# Patient Record
Sex: Female | Born: 1935 | Race: White | Hispanic: No | Smoking: Never smoker
Health system: Southern US, Community
[De-identification: ages and names within clinical notes are randomized; demographics above are authoritative.]

## PROBLEM LIST (undated history)

## (undated) DIAGNOSIS — E038 Other specified hypothyroidism: Secondary | ICD-10-CM

## (undated) DIAGNOSIS — E039 Hypothyroidism, unspecified: Secondary | ICD-10-CM

## (undated) DIAGNOSIS — F32A Depression, unspecified: Secondary | ICD-10-CM

## (undated) DIAGNOSIS — I10 Essential (primary) hypertension: Secondary | ICD-10-CM

## (undated) DIAGNOSIS — E7800 Pure hypercholesterolemia, unspecified: Secondary | ICD-10-CM

## (undated) DIAGNOSIS — F329 Major depressive disorder, single episode, unspecified: Secondary | ICD-10-CM

## (undated) DIAGNOSIS — M1990 Unspecified osteoarthritis, unspecified site: Secondary | ICD-10-CM

## (undated) DIAGNOSIS — H409 Unspecified glaucoma: Secondary | ICD-10-CM

## (undated) HISTORY — DX: Depression, unspecified: F32.A

## (undated) HISTORY — DX: Unspecified glaucoma: H40.9

## (undated) HISTORY — DX: Major depressive disorder, single episode, unspecified: F32.9

## (undated) HISTORY — PX: HIP FRACTURE SURGERY: SHX118

## (undated) HISTORY — DX: Hypothyroidism, unspecified: E03.9

## (undated) HISTORY — DX: Essential (primary) hypertension: I10

## (undated) HISTORY — DX: Pure hypercholesterolemia, unspecified: E78.00

## (undated) HISTORY — DX: Unspecified osteoarthritis, unspecified site: M19.90

## (undated) HISTORY — DX: Other specified hypothyroidism: E03.8

---

## 1976-01-26 HISTORY — PX: ABDOMINAL HYSTERECTOMY: SHX81

## 2004-12-23 ENCOUNTER — Inpatient Hospital Stay: Payer: Self-pay | Admitting: Internal Medicine

## 2004-12-23 ENCOUNTER — Other Ambulatory Visit: Payer: Self-pay

## 2011-02-18 DIAGNOSIS — R945 Abnormal results of liver function studies: Secondary | ICD-10-CM | POA: Diagnosis not present

## 2011-02-18 DIAGNOSIS — I10 Essential (primary) hypertension: Secondary | ICD-10-CM | POA: Diagnosis not present

## 2011-02-18 DIAGNOSIS — E7800 Pure hypercholesterolemia, unspecified: Secondary | ICD-10-CM | POA: Diagnosis not present

## 2011-02-18 DIAGNOSIS — R7989 Other specified abnormal findings of blood chemistry: Secondary | ICD-10-CM | POA: Diagnosis not present

## 2011-02-18 DIAGNOSIS — R7309 Other abnormal glucose: Secondary | ICD-10-CM | POA: Diagnosis not present

## 2011-04-05 DIAGNOSIS — F332 Major depressive disorder, recurrent severe without psychotic features: Secondary | ICD-10-CM | POA: Diagnosis not present

## 2011-04-29 DIAGNOSIS — H02039 Senile entropion of unspecified eye, unspecified eyelid: Secondary | ICD-10-CM | POA: Diagnosis not present

## 2011-05-04 DIAGNOSIS — H02039 Senile entropion of unspecified eye, unspecified eyelid: Secondary | ICD-10-CM | POA: Diagnosis not present

## 2011-05-05 DIAGNOSIS — R5381 Other malaise: Secondary | ICD-10-CM | POA: Diagnosis not present

## 2011-05-05 DIAGNOSIS — E039 Hypothyroidism, unspecified: Secondary | ICD-10-CM | POA: Diagnosis not present

## 2011-05-11 DIAGNOSIS — H02049 Spastic entropion of unspecified eye, unspecified eyelid: Secondary | ICD-10-CM | POA: Diagnosis not present

## 2011-05-11 DIAGNOSIS — H02039 Senile entropion of unspecified eye, unspecified eyelid: Secondary | ICD-10-CM | POA: Diagnosis not present

## 2011-05-19 DIAGNOSIS — R040 Epistaxis: Secondary | ICD-10-CM | POA: Diagnosis not present

## 2011-05-24 DIAGNOSIS — H0019 Chalazion unspecified eye, unspecified eyelid: Secondary | ICD-10-CM | POA: Diagnosis not present

## 2011-06-22 DIAGNOSIS — H4010X0 Unspecified open-angle glaucoma, stage unspecified: Secondary | ICD-10-CM | POA: Diagnosis not present

## 2011-07-15 DIAGNOSIS — F332 Major depressive disorder, recurrent severe without psychotic features: Secondary | ICD-10-CM | POA: Diagnosis not present

## 2011-10-14 DIAGNOSIS — F332 Major depressive disorder, recurrent severe without psychotic features: Secondary | ICD-10-CM | POA: Diagnosis not present

## 2011-10-18 DIAGNOSIS — F331 Major depressive disorder, recurrent, moderate: Secondary | ICD-10-CM | POA: Diagnosis not present

## 2011-11-03 ENCOUNTER — Ambulatory Visit (INDEPENDENT_AMBULATORY_CARE_PROVIDER_SITE_OTHER): Payer: Medicare Other | Admitting: Internal Medicine

## 2011-11-03 ENCOUNTER — Encounter: Payer: Self-pay | Admitting: Internal Medicine

## 2011-11-03 VITALS — BP 129/70 | HR 85 | Temp 97.8°F | Ht 60.75 in | Wt 130.5 lb

## 2011-11-03 DIAGNOSIS — E039 Hypothyroidism, unspecified: Secondary | ICD-10-CM

## 2011-11-03 DIAGNOSIS — F32A Depression, unspecified: Secondary | ICD-10-CM | POA: Insufficient documentation

## 2011-11-03 DIAGNOSIS — R7309 Other abnormal glucose: Secondary | ICD-10-CM | POA: Diagnosis not present

## 2011-11-03 DIAGNOSIS — I10 Essential (primary) hypertension: Secondary | ICD-10-CM | POA: Diagnosis not present

## 2011-11-03 DIAGNOSIS — E7800 Pure hypercholesterolemia, unspecified: Secondary | ICD-10-CM | POA: Diagnosis not present

## 2011-11-03 DIAGNOSIS — E038 Other specified hypothyroidism: Secondary | ICD-10-CM | POA: Insufficient documentation

## 2011-11-03 DIAGNOSIS — R739 Hyperglycemia, unspecified: Secondary | ICD-10-CM | POA: Insufficient documentation

## 2011-11-03 DIAGNOSIS — R634 Abnormal weight loss: Secondary | ICD-10-CM

## 2011-11-03 DIAGNOSIS — F3289 Other specified depressive episodes: Secondary | ICD-10-CM

## 2011-11-03 DIAGNOSIS — F329 Major depressive disorder, single episode, unspecified: Secondary | ICD-10-CM

## 2011-11-03 LAB — CBC WITH DIFFERENTIAL/PLATELET
Basophils Relative: 0.5 % (ref 0.0–3.0)
Eosinophils Relative: 2.2 % (ref 0.0–5.0)
HCT: 42.5 % (ref 36.0–46.0)
Lymphs Abs: 1.7 10*3/uL (ref 0.7–4.0)
MCV: 90.7 fl (ref 78.0–100.0)
Monocytes Absolute: 0.4 10*3/uL (ref 0.1–1.0)
Platelets: 313 10*3/uL (ref 150.0–400.0)
WBC: 6.1 10*3/uL (ref 4.5–10.5)

## 2011-11-03 LAB — COMPREHENSIVE METABOLIC PANEL
Alkaline Phosphatase: 114 U/L (ref 39–117)
BUN: 12 mg/dL (ref 6–23)
Creatinine, Ser: 0.7 mg/dL (ref 0.4–1.2)
Glucose, Bld: 111 mg/dL — ABNORMAL HIGH (ref 70–99)
Sodium: 140 mEq/L (ref 135–145)
Total Bilirubin: 0.5 mg/dL (ref 0.3–1.2)

## 2011-11-03 LAB — TSH: TSH: 0.15 u[IU]/mL — ABNORMAL LOW (ref 0.35–5.50)

## 2011-11-03 LAB — LIPID PANEL
Cholesterol: 225 mg/dL — ABNORMAL HIGH (ref 0–200)
Triglycerides: 139 mg/dL (ref 0.0–149.0)

## 2011-11-03 LAB — LDL CHOLESTEROL, DIRECT: Direct LDL: 138.6 mg/dL

## 2011-11-03 LAB — T3, FREE: T3, Free: 2.8 pg/mL (ref 2.3–4.2)

## 2011-11-03 NOTE — Assessment & Plan Note (Signed)
Has adjusted her diet and lost weight.  Check met b and a1c.

## 2011-11-03 NOTE — Assessment & Plan Note (Signed)
Stable.  Continues to see Dr Lenis Noon.  On Effexor.

## 2011-11-03 NOTE — Assessment & Plan Note (Signed)
Blood pressure has been under good control.  Continue same meds.  Check met b.

## 2011-11-03 NOTE — Patient Instructions (Signed)
It was nice seeing you today.  I am going to check some labs today.  We will notify you of the results once they are available.

## 2011-11-03 NOTE — Assessment & Plan Note (Signed)
Has adjusted her diet.  Check lipid profile.

## 2011-11-03 NOTE — Progress Notes (Signed)
  Subjective:    Patient ID: Sarah [name], female    DOB: 09-05-1923, 78 y.o.   MRN: [id number]  HPI 78 year old female with past history of hypertension and hypercholesterolemia who comes in today for a scheduled follow up.  She has been doing relatively well.  She is still being followed by Dr Lenis Noon.  States she had some labs recently and he wanted her to follow up with me regarding these labs.  I do not have a copy of the lab results.  She has "cut out sugar" and has lost weight.  She is eating.  No nausea or vomiting.  Regarding her stress and depression, she feels things are stable.    Past Medical History  Diagnosis Date  . Arthritis   . Depression   . Glaucoma   . Hypercholesterolemia   . Thyroid disease   . Hypertension     Review of Systems Patient denies any headache, lightheadedness or dizziness.  No chest pain, tightness or palpatations. No increased shortness of breath, cough or congestion.  No acid reflux, dysphagia or odynophagia.  No nausea or vomiting.  No abdominal pain or cramping.  No bowel change, such as diarrhea, constipation, BRBPR or melana.  No urine change.   Self reported weight loss with diet adjustment.     Objective:   Physical Exam Filed Vitals:   11/03/11 0946  BP: 129/70  Pulse: 85  Temp: 97.8 F (55.44 C)   78 year old female in no acute distress.   HEENT:  Nares - clear.  OP- without lesions or erythema.  NECK:  Supple, nontender.  No audible carotid bruit.   HEART:  Appears to be regular. LUNGS:  Without crackles or wheezing audible.  Respirations even and unlabored.   RADIAL PULSE:  Equal bilaterally.  ABDOMEN:  Soft, nontender.  No audible abdominal bruit.   EXTREMITIES:  No increased edema to be present.                  Assessment & Plan:  WEIGHT LOSS.  She states she has cut out sweets because she was worried about her sugar.  Will check cbc, met c, thyroid function and a1c.    HEALTH MAINTENANCE.  Schedule for a physical next  visit.  She declines mammogram and colonoscopy.

## 2011-11-03 NOTE — Assessment & Plan Note (Signed)
With the weight loss, will recheck tsh, free T3 and free T4.

## 2011-12-28 DIAGNOSIS — H4089 Other specified glaucoma: Secondary | ICD-10-CM | POA: Diagnosis not present

## 2011-12-30 DIAGNOSIS — F332 Major depressive disorder, recurrent severe without psychotic features: Secondary | ICD-10-CM | POA: Diagnosis not present

## 2012-02-21 ENCOUNTER — Ambulatory Visit (INDEPENDENT_AMBULATORY_CARE_PROVIDER_SITE_OTHER): Payer: Medicare Other | Admitting: Internal Medicine

## 2012-02-21 ENCOUNTER — Encounter: Payer: Self-pay | Admitting: Internal Medicine

## 2012-02-21 VITALS — BP 160/78 | HR 79 | Ht 60.75 in | Wt 136.5 lb

## 2012-02-21 DIAGNOSIS — I10 Essential (primary) hypertension: Secondary | ICD-10-CM | POA: Diagnosis not present

## 2012-02-21 DIAGNOSIS — E038 Other specified hypothyroidism: Secondary | ICD-10-CM | POA: Diagnosis not present

## 2012-02-21 DIAGNOSIS — E7800 Pure hypercholesterolemia, unspecified: Secondary | ICD-10-CM | POA: Diagnosis not present

## 2012-02-21 DIAGNOSIS — F329 Major depressive disorder, single episode, unspecified: Secondary | ICD-10-CM

## 2012-02-21 DIAGNOSIS — R7309 Other abnormal glucose: Secondary | ICD-10-CM

## 2012-02-21 DIAGNOSIS — F3289 Other specified depressive episodes: Secondary | ICD-10-CM

## 2012-02-21 DIAGNOSIS — F32A Depression, unspecified: Secondary | ICD-10-CM

## 2012-02-21 DIAGNOSIS — E039 Hypothyroidism, unspecified: Secondary | ICD-10-CM

## 2012-02-21 DIAGNOSIS — R739 Hyperglycemia, unspecified: Secondary | ICD-10-CM

## 2012-02-21 LAB — LIPID PANEL
Cholesterol: 229 mg/dL — ABNORMAL HIGH (ref 0–200)
HDL: 57.2 mg/dL (ref >39.00)
Triglycerides: 166 mg/dL — ABNORMAL HIGH (ref 0.0–149.0)

## 2012-02-21 LAB — BASIC METABOLIC PANEL
GFR: 82.9 mL/min (ref >60.00)
Potassium: 5.3 mEq/L — ABNORMAL HIGH (ref 3.5–5.1)
Sodium: 143 mEq/L (ref 135–145)

## 2012-02-21 LAB — HEMOGLOBIN A1C: Hgb A1c MFr Bld: 6 % (ref 4.6–6.5)

## 2012-02-21 LAB — TSH: TSH: 0.13 u[IU]/mL — ABNORMAL LOW (ref 0.35–5.50)

## 2012-02-21 MED ORDER — FLUTICASONE PROPIONATE 50 MCG/ACT NA SUSP: 2.0000 | Freq: Every day | NASAL | Status: DC

## 2012-02-21 NOTE — Progress Notes (Signed)
  Subjective:    Patient ID: Sarah [name], female    DOB: 06-16-1923, 79 y.o.   MRN: [id number]  HPI 79 year old female with past history of hypertension and hypercholesterolemia who comes in today to follow up on these issues as well as for a complete physical exam.  She has been doing relatively well.  She is still being followed by Dr Lenis Noon.  Feels she is doing well from a psychiatry stand point. No chest pain or tightness.  Breathing stable.  Some persistent wrist and shoulder pain.  Declines any further evaluation.  Not taking anything.  Discussed taking tylenol.  No nausea or vomiting.  Eating and drinking well.  [place] recently.  Has wood floors.  Was wearing socks.  Slipped - socks.  Did not hit her head.  No residual problems.      Past Medical History  Diagnosis Date  . Arthritis   . Depression   . Glaucoma   . Hypercholesterolemia   . Subclinical hypothyroidism   . Hypertension     Current Outpatient Prescriptions on File Prior to Visit  Medication Sig Dispense Refill  . amLODipine (NORVASC) 5 MG tablet Take 5 mg by mouth daily.      . Multiple Vitamins-Minerals (CENTRUM SILVER PO) Take 1 tablet by mouth daily.      Marland Kitchen venlafaxine XR (EFFEXOR-XR) 150 MG 24 hr capsule Take three tablets by mouth daily      . fluticasone (FLONASE) 50 MCG/ACT nasal spray Place 2 sprays into the nose daily.  16 g  2    Review of Systems Patient denies any headache, lightheadedness or dizziness.  No chest pain, tightness or palpatations. No increased shortness of breath, cough or congestion.  No acid reflux, dysphagia or odynophagia.  No nausea or vomiting.  No abdominal pain or cramping.  No bowel change, such as diarrhea, constipation, BRBPR or melana.  No urine change.   Some fatigue, but overall she feels she is doing well.      Objective:   Physical Exam  Filed Vitals:   02/21/12 1054  BP: 160/78  Pulse: 79   Blood pressure recheck:  61-14/27  79 year old female in no acute  distress.   HEENT:  Nares- clear.  Oropharynx - without lesions. NECK:  Supple.  Nontender.  No audible bruit.  HEART:  Appears to be regular. LUNGS:  No crackles or wheezing audible.  Respirations even and unlabored.  CHEST:  No bruising over the chest wall.  RADIAL PULSE:  Equal bilaterally.    BREASTS:  She declined.  ABDOMEN:  Soft, nontender.  Bowel sounds present and normal.  No audible abdominal bruit.  GU:  She declined.  RECTAL:  She declined.    EXTREMITIES:  No increased edema present.  DP pulses palpable and equal bilaterally.      MSK:  Limited rom in her left shoulder.  Arthritis changes - right wrist.       Assessment & Plan:  MSK.  See above.  She declines any further w/up or treatment.  Tylenol as directed.  Will notify me if she changes her mind.   FATIGUE.  Check cbc, met c and tsh.     HEALTH MAINTENANCE.  Physical today.  She declines mammogram, colonoscopy, bone density and breast/pelvic exam.

## 2012-02-22 ENCOUNTER — Encounter: Payer: Self-pay | Admitting: Internal Medicine

## 2012-02-22 NOTE — Assessment & Plan Note (Signed)
Low cholesterol diet.  Check lipid panel.    

## 2012-02-22 NOTE — Assessment & Plan Note (Signed)
Stable.  Followed by Dr Lenis Noon.

## 2012-02-22 NOTE — Assessment & Plan Note (Signed)
Check tsh, free T4 and free T3.

## 2012-02-22 NOTE — Assessment & Plan Note (Signed)
Blood pressure as outlined.  Follow.  Same medication regimen.  Check metabolic panel.

## 2012-02-22 NOTE — Assessment & Plan Note (Signed)
Low carb diet.  Check a1c.

## 2012-02-23 ENCOUNTER — Other Ambulatory Visit: Payer: Self-pay | Admitting: *Deleted

## 2012-02-24 MED ORDER — AMLODIPINE BESYLATE 5 MG PO TABS: 5.0000 mg | ORAL_TABLET | Freq: Every day | ORAL | Status: DC

## 2012-02-24 NOTE — Telephone Encounter (Signed)
Sent in to pharmacy.  

## 2012-02-26 ENCOUNTER — Telehealth: Payer: Self-pay | Admitting: Internal Medicine

## 2012-02-26 ENCOUNTER — Other Ambulatory Visit: Payer: Self-pay | Admitting: Internal Medicine

## 2012-02-26 DIAGNOSIS — E875 Hyperkalemia: Secondary | ICD-10-CM

## 2012-02-26 NOTE — Telephone Encounter (Signed)
Pt notified of labs and need for follow up potassium check.  She is coming Tuesday 02/29/12 at 10:00 for labs.  Please put on lab schedule.  Pt aware of time.

## 2012-02-26 NOTE — Progress Notes (Signed)
Order placed for follow up potassium.  

## 2012-02-28 NOTE — Telephone Encounter (Signed)
Appointment made

## 2012-02-29 ENCOUNTER — Other Ambulatory Visit (INDEPENDENT_AMBULATORY_CARE_PROVIDER_SITE_OTHER): Payer: Medicare Other

## 2012-02-29 DIAGNOSIS — E875 Hyperkalemia: Secondary | ICD-10-CM | POA: Diagnosis not present

## 2012-02-29 LAB — POTASSIUM: Potassium: 3.7 mEq/L (ref 3.5–5.1)

## 2012-04-17 ENCOUNTER — Telehealth: Payer: Self-pay | Admitting: Internal Medicine

## 2012-04-17 NOTE — Telephone Encounter (Signed)
Just an FYI

## 2012-04-17 NOTE — Telephone Encounter (Signed)
Patient Information:  Caller Name: Sarah Sarah [name] Sarah [name]  Phone: [phone]  Patient: Sarah Sarah [name], Sarah [name]  Gender: Female  DOB: 02-Jan-1924  Age: 79 Years  PCP: Dale Traill  Office Follow Up:  Does the office need to follow up with this patient?: No  Instructions For The Office: N/A  RN Note:  Caller is eating, drinking, and voiding within normal limits per caller. Caller has been walking with a cane. Right hip pain. Caller denies bruising. Caller tripped while putting on jeans and fell on hip.  Symptoms  Reason For Call & Symptoms: fell and hurt hip.  Reviewed Health History In EMR: Yes  Reviewed Medications In EMR: Yes  Reviewed Allergies In EMR: Yes  Reviewed Surgeries / Procedures: Yes  Date of Onset of Symptoms: 04/13/2012  Guideline(s) Used:  Hip Injury  Disposition Per Guideline:   See Today or Tomorrow in Office  Reason For Disposition Reached:   High-risk adult (e.g., age > 8, osteoporosis, chronic steroid use)  Advice Given:  Apply Heat to the Area:  Beginning 48 hours after an injury, apply a warm washcloth or heating pad for 10 minutes three times a day.  This will help increase blood flow and improve healing.  Rest vs. Movement:  Movement is generally more healing in the long term than rest.  Continue normal activities as much as your pain permits.  Avoid running and active sports for 1-2 weeks or until the pain and swelling are gone.  Complete rest should only be used for the first day or two after an injury. If it really hurts too much to walk, you will need to see the doctor.  Reassurance - Direct Blow (Contusion, Bruise)  A direct blow to your hip can cause a contusion. Contusion is the medical term for bruise.  Symptoms are mild pain, swelling, and/or bruising.  Here is some care advice that should help.  Expected Course:  Pain, swelling, and bruising usually start to get better 2 to 3 days after an injury.  Swelling most often is gone after 1 week.  Bruises fade away slowly over 1-2 weeks.  It may take 2 weeks for pain and tenderness of the injured area to go away.  Call Back If:  Pain becomes severe  Pain does not improve after 3 days  Pain or swelling lasts more than 2 weeks  You become worse.  Patient Will Follow Care Advice:  YES  Appointment Scheduled:  04/18/2012 09:45:00 Appointment Scheduled Provider:  Dale Ulm

## 2012-04-18 ENCOUNTER — Encounter: Payer: Self-pay | Admitting: Internal Medicine

## 2012-04-18 ENCOUNTER — Ambulatory Visit (INDEPENDENT_AMBULATORY_CARE_PROVIDER_SITE_OTHER): Payer: Medicare Other | Admitting: Internal Medicine

## 2012-04-18 VITALS — BP 150/80 | HR 88 | Temp 98.5°F | Ht 60.75 in | Wt 140.5 lb

## 2012-04-18 DIAGNOSIS — I10 Essential (primary) hypertension: Secondary | ICD-10-CM

## 2012-04-24 ENCOUNTER — Encounter: Payer: Self-pay | Admitting: Internal Medicine

## 2012-04-24 NOTE — Progress Notes (Signed)
  Subjective:    Patient ID: Sarah [name], female    DOB: 1924-01-10, 79 y.o.   MRN: [id number]  HPI 79 year old female with past history of hypertension and hypercholesterolemia who comes in today as a work in with concerns regarding rupper leg/hip pain.  She has been doing relatively well.  States she was standing and putting on her pants.  Her left foot caught.  She fell.  No head injury.  No other injury.  Went to the grocery store immediately after the fall.  Later noticed some increased discomfort.  If she has been sitting for a while and then goes to get up, she will notice the some discomfort.  Able to walk without increased difficulty.  No radiation of pain.  No numbness or tingling.      Past Medical History  Diagnosis Date  . Arthritis   . Depression   . Glaucoma   . Hypercholesterolemia   . Subclinical hypothyroidism   . Hypertension     Current Outpatient Prescriptions on File Prior to Visit  Medication Sig Dispense Refill  . amLODipine (NORVASC) 5 MG tablet Take 1 tablet (5 mg total) by mouth daily.  30 tablet  5  . fluticasone (FLONASE) 50 MCG/ACT nasal spray Place 2 sprays into the nose daily.  16 g  2  . Multiple Vitamins-Minerals (CENTRUM SILVER PO) Take 1 tablet by mouth daily.      Marland Kitchen venlafaxine XR (EFFEXOR-XR) 150 MG 24 hr capsule Take three tablets by mouth daily       No current facility-administered medications on file prior to visit.    Review of Systems Patient denies any headache, lightheadedness or dizziness.  No chest pain, tightness or palpitations. No increased shortness of breath, cough or congestion.  No acid reflux, dysphagia or odynophagia.  Does report upper leg/hip pain.  Better.  Taking some tylenol.  Able to walk without significant difficulty.      Objective:   Physical Exam  Filed Vitals:   04/18/12 0946  BP: 150/80  Pulse: 88  Temp: 98.5 F (14.49 C)   79 year old female in no acute distress.  NECK:  Supple.  Nontender.  HEART:   Appears to be regular. LUNGS:  No crackles or wheezing audible.  Respirations even and unlabored. RADIAL PULSE:  Equal bilaterally.   ABDOMEN:  Soft, nontender.  Bowel sounds present and normal.  No audible abdominal bruit.     EXTREMITIES:  No increased edema present.  DP pulses palpable and equal bilaterally.      MSK:  No pain with abduction and adduction lower extremities.  No significant pain with increased flexion and extension at the hip.  Able to sit, stand and walk without difficulty.        Assessment & Plan:  MSK.  See above.  Will hold on xray.  Tylenol as directed.  Will follow.  Stretches.  Will notify me if or be reevaluated if symptoms worse or change.   HEALTH MAINTENANCE.  Physical last visit.  She declines mammogram, colonoscopy, bone density and breast/pelvic exam.

## 2012-04-24 NOTE — Assessment & Plan Note (Signed)
Blood pressure as outlined.  Follow.  Same medication regimen.  Check metabolic panel.

## 2012-04-26 DIAGNOSIS — F332 Major depressive disorder, recurrent severe without psychotic features: Secondary | ICD-10-CM | POA: Diagnosis not present

## 2012-05-05 ENCOUNTER — Telehealth: Payer: Self-pay | Admitting: Internal Medicine

## 2012-05-05 ENCOUNTER — Ambulatory Visit (INDEPENDENT_AMBULATORY_CARE_PROVIDER_SITE_OTHER)
Admission: RE | Admit: 2012-05-05 | Discharge: 2012-05-05 | Disposition: A | Discharge status: Home / Self Care | Payer: Medicare Other | Source: Ambulatory Visit | Attending: Internal Medicine | Admitting: Internal Medicine

## 2012-05-05 ENCOUNTER — Ambulatory Visit (INDEPENDENT_AMBULATORY_CARE_PROVIDER_SITE_OTHER): Payer: Medicare Other | Admitting: Internal Medicine

## 2012-05-05 ENCOUNTER — Inpatient Hospital Stay: Payer: Self-pay | Admitting: Orthopedic Surgery

## 2012-05-05 ENCOUNTER — Encounter: Payer: Self-pay | Admitting: Internal Medicine

## 2012-05-05 VITALS — BP 138/74 | HR 90 | Temp 98.2°F | Resp 18 | Wt 140.5 lb

## 2012-05-05 DIAGNOSIS — M6281 Muscle weakness (generalized): Secondary | ICD-10-CM | POA: Diagnosis not present

## 2012-05-05 DIAGNOSIS — M25559 Pain in unspecified hip: Secondary | ICD-10-CM

## 2012-05-05 DIAGNOSIS — N390 Urinary tract infection, site not specified: Secondary | ICD-10-CM | POA: Diagnosis present

## 2012-05-05 DIAGNOSIS — E785 Hyperlipidemia, unspecified: Secondary | ICD-10-CM | POA: Diagnosis present

## 2012-05-05 DIAGNOSIS — S72019A Unspecified intracapsular fracture of unspecified femur, initial encounter for closed fracture: Secondary | ICD-10-CM | POA: Diagnosis not present

## 2012-05-05 DIAGNOSIS — F3289 Other specified depressive episodes: Secondary | ICD-10-CM | POA: Diagnosis not present

## 2012-05-05 DIAGNOSIS — S72009A Fracture of unspecified part of neck of unspecified femur, initial encounter for closed fracture: Secondary | ICD-10-CM | POA: Diagnosis not present

## 2012-05-05 DIAGNOSIS — M549 Dorsalgia, unspecified: Secondary | ICD-10-CM

## 2012-05-05 DIAGNOSIS — M25551 Pain in right hip: Secondary | ICD-10-CM

## 2012-05-05 DIAGNOSIS — E871 Hypo-osmolality and hyponatremia: Secondary | ICD-10-CM | POA: Diagnosis not present

## 2012-05-05 DIAGNOSIS — Z5189 Encounter for other specified aftercare: Secondary | ICD-10-CM | POA: Diagnosis not present

## 2012-05-05 DIAGNOSIS — IMO0002 Reserved for concepts with insufficient information to code with codable children: Secondary | ICD-10-CM | POA: Diagnosis not present

## 2012-05-05 DIAGNOSIS — S72009D Fracture of unspecified part of neck of unspecified femur, subsequent encounter for closed fracture with routine healing: Secondary | ICD-10-CM | POA: Diagnosis not present

## 2012-05-05 DIAGNOSIS — R6889 Other general symptoms and signs: Secondary | ICD-10-CM | POA: Diagnosis not present

## 2012-05-05 DIAGNOSIS — Z90710 Acquired absence of both cervix and uterus: Secondary | ICD-10-CM | POA: Diagnosis not present

## 2012-05-05 DIAGNOSIS — I9589 Other hypotension: Secondary | ICD-10-CM | POA: Diagnosis not present

## 2012-05-05 DIAGNOSIS — R222 Localized swelling, mass and lump, trunk: Secondary | ICD-10-CM | POA: Diagnosis not present

## 2012-05-05 DIAGNOSIS — H409 Unspecified glaucoma: Secondary | ICD-10-CM | POA: Diagnosis not present

## 2012-05-05 DIAGNOSIS — Z4789 Encounter for other orthopedic aftercare: Secondary | ICD-10-CM | POA: Diagnosis not present

## 2012-05-05 DIAGNOSIS — Z01818 Encounter for other preprocedural examination: Secondary | ICD-10-CM | POA: Diagnosis not present

## 2012-05-05 DIAGNOSIS — I10 Essential (primary) hypertension: Secondary | ICD-10-CM

## 2012-05-05 DIAGNOSIS — M810 Age-related osteoporosis without current pathological fracture: Secondary | ICD-10-CM | POA: Diagnosis not present

## 2012-05-05 DIAGNOSIS — E041 Nontoxic single thyroid nodule: Secondary | ICD-10-CM | POA: Diagnosis present

## 2012-05-05 DIAGNOSIS — R269 Unspecified abnormalities of gait and mobility: Secondary | ICD-10-CM | POA: Diagnosis not present

## 2012-05-05 DIAGNOSIS — F411 Generalized anxiety disorder: Secondary | ICD-10-CM | POA: Diagnosis not present

## 2012-05-05 DIAGNOSIS — R404 Transient alteration of awareness: Secondary | ICD-10-CM | POA: Diagnosis not present

## 2012-05-05 DIAGNOSIS — J9819 Other pulmonary collapse: Secondary | ICD-10-CM | POA: Diagnosis not present

## 2012-05-05 DIAGNOSIS — F32A Depression, unspecified: Secondary | ICD-10-CM

## 2012-05-05 DIAGNOSIS — M47817 Spondylosis without myelopathy or radiculopathy, lumbosacral region: Secondary | ICD-10-CM | POA: Diagnosis not present

## 2012-05-05 DIAGNOSIS — Z9181 History of falling: Secondary | ICD-10-CM | POA: Diagnosis not present

## 2012-05-05 DIAGNOSIS — Z79899 Other long term (current) drug therapy: Secondary | ICD-10-CM | POA: Diagnosis not present

## 2012-05-05 DIAGNOSIS — F329 Major depressive disorder, single episode, unspecified: Secondary | ICD-10-CM

## 2012-05-05 DIAGNOSIS — Z885 Allergy status to narcotic agent status: Secondary | ICD-10-CM | POA: Diagnosis not present

## 2012-05-05 DIAGNOSIS — E039 Hypothyroidism, unspecified: Secondary | ICD-10-CM | POA: Diagnosis present

## 2012-05-05 DIAGNOSIS — S72033A Displaced midcervical fracture of unspecified femur, initial encounter for closed fracture: Secondary | ICD-10-CM | POA: Diagnosis not present

## 2012-05-05 DIAGNOSIS — Z0389 Encounter for observation for other suspected diseases and conditions ruled out: Secondary | ICD-10-CM | POA: Diagnosis not present

## 2012-05-05 LAB — COMPREHENSIVE METABOLIC PANEL
Albumin: 4.1 g/dL (ref 3.4–5.0)
Alkaline Phosphatase: 197 U/L — ABNORMAL HIGH (ref 50–136)
Bilirubin,Total: 0.5 mg/dL (ref 0.2–1.0)
Calcium, Total: 9.6 mg/dL (ref 8.5–10.1)
Osmolality: 266 (ref 275–301)
Potassium: 3.8 mmol/L (ref 3.5–5.1)
SGOT(AST): 26 U/L (ref 15–37)
SGPT (ALT): 25 U/L (ref 12–78)

## 2012-05-05 LAB — CBC
MCH: 29.7 pg (ref 26.0–34.0)
MCV: 88 fL (ref 80–100)
RBC: 4.38 10*6/uL (ref 3.80–5.20)

## 2012-05-05 LAB — TSH: Thyroid Stimulating Horm: 0.393 u[IU]/mL — ABNORMAL LOW

## 2012-05-05 LAB — URINALYSIS, COMPLETE
Blood: NEGATIVE
Nitrite: NEGATIVE
Specific Gravity: 1.008 (ref 1.003–1.030)

## 2012-05-05 LAB — APTT: Activated PTT: 30.3 secs (ref 23.6–35.9)

## 2012-05-05 LAB — HEMOGLOBIN: HGB: 13.5 g/dL (ref 12.0–16.0)

## 2012-05-05 LAB — PROTIME-INR: Prothrombin Time: 13.2 secs (ref 11.5–14.7)

## 2012-05-05 NOTE — Telephone Encounter (Signed)
Marylu Lund  Ms Rivenbarks neighbor called.  Ms Talsma would like to talk to you  Please call [phone]

## 2012-05-05 NOTE — Telephone Encounter (Signed)
Called pt.  Discussed situation with her and questions answered.

## 2012-05-06 ENCOUNTER — Encounter: Payer: Self-pay | Admitting: Internal Medicine

## 2012-05-06 LAB — BASIC METABOLIC PANEL
Anion Gap: 5 — ABNORMAL LOW (ref 7–16)
Calcium, Total: 9.1 mg/dL (ref 8.5–10.1)
Creatinine: 0.65 mg/dL (ref 0.60–1.30)
EGFR (Non-African Amer.): >60
Osmolality: 277 (ref 275–301)
Sodium: 139 mmol/L (ref 136–145)

## 2012-05-06 LAB — CBC WITH DIFFERENTIAL/PLATELET
Basophil %: 0.6 %
Eosinophil #: 0.3 10*3/uL (ref 0.0–0.7)
Eosinophil %: 5.5 %
HGB: 12.2 g/dL (ref 12.0–16.0)
Lymphocyte #: 2 10*3/uL (ref 1.0–3.6)
MCH: 29.4 pg (ref 26.0–34.0)
MCHC: 33 g/dL (ref 32.0–36.0)
Monocyte %: 12.5 %
Neutrophil %: 45.9 %
Platelet: 308 10*3/uL (ref 150–440)
RBC: 4.14 10*6/uL (ref 3.80–5.20)

## 2012-05-06 NOTE — Progress Notes (Signed)
  Subjective:    Patient ID: Sarah [name], female    DOB: Jun 01, 1923, 79 y.o.   MRN: [id number]  Leg Pain   79 year old female with past history of hypertension and hypercholesterolemia who comes in today as a work in with concerns regarding some right upper leg and hip pain. Last visit, she reports that she was standing and putting on her pants.  Her foot caught.  She fell.  No head injury.  No other injury.  Went to the grocery store immediately after the fall.  Later noticed some increased discomfort. I saw her on 04/18/12.  See that note for details.  She was able to stand and walk without significant difficulty. No radiation of pain.  No numbness or tingling.  Today she comes in with increased pain.  States it now hurts to put weight on that leg.  When she is sitting - no pain.      Past Medical History  Diagnosis Date  . Arthritis   . Depression   . Glaucoma   . Hypercholesterolemia   . Subclinical hypothyroidism   . Hypertension     Current Outpatient Prescriptions on File Prior to Visit  Medication Sig Dispense Refill  . amLODipine (NORVASC) 5 MG tablet Take 1 tablet (5 mg total) by mouth daily.  30 tablet  5  . fluticasone (FLONASE) 50 MCG/ACT nasal spray Place 2 sprays into the nose daily.  16 g  2  . Multiple Vitamins-Minerals (CENTRUM SILVER PO) Take 1 tablet by mouth daily.      Marland Kitchen venlafaxine XR (EFFEXOR-XR) 150 MG 24 hr capsule Take three tablets by mouth daily       No current facility-administered medications on file prior to visit.    Review of Systems Patient denies any headache, lightheadedness or dizziness.  No chest pain, tightness or palpitations. No increased shortness of breath, cough or congestion.  No acid reflux, dysphagia or odynophagia.  Does report upper leg/hip pain.  Noticed when she puts weight on her leg.  No pain with sitting or lying.      Objective:   Physical Exam  Filed Vitals:   05/05/12 1049  BP: 138/74  Pulse: 90  Temp: 98.2 F  (36.8 C)  Resp: 51   79 year old female in no acute distress.  NECK:  Supple.  Nontender.  HEART:  Appears to be regular. LUNGS:  No crackles or wheezing audible.  Respirations even and unlabored. RADIAL PULSE:  Equal bilaterally.   ABDOMEN:  Soft, nontender.  Bowel sounds present and normal.  No audible abdominal bruit.     EXTREMITIES:  No increased edema present.  DP pulses palpable and equal bilaterally.      MSK:  Some minimal pain with abduction and adduction right lower extremity.  No significant pain with increased flexion and extension at the hip.  Able to sit without pain.  Increased pain with standing - weight on right leg.        Assessment & Plan:  MSK.  See above. Increased pain today.  Will check right hip xray and L-S spine xray.  Tylenol as needed.  Further w/up pending results.   HEALTH MAINTENANCE.  Physical 02/21/12.  She declines mammogram, colonoscopy, bone density and breast/pelvic exam.

## 2012-05-06 NOTE — Assessment & Plan Note (Addendum)
Blood pressure as outlined.  Follow.  Same medication regimen.  Follow metabolic panel.     

## 2012-05-06 NOTE — Assessment & Plan Note (Signed)
Stable.  Followed by Dr Lenis Noon.

## 2012-05-07 LAB — CBC WITH DIFFERENTIAL/PLATELET
Basophil %: 0.4 %
Eosinophil #: 0.1 10*3/uL (ref 0.0–0.7)
HCT: 34.4 % — ABNORMAL LOW (ref 35.0–47.0)
Lymphocyte #: 1.7 10*3/uL (ref 1.0–3.6)
Lymphocyte %: 17 %
MCHC: 33.1 g/dL (ref 32.0–36.0)
Monocyte #: 1.4 x10 3/mm — ABNORMAL HIGH (ref 0.2–0.9)
Monocyte %: 14 %
Neutrophil %: 67.5 %
Platelet: 310 10*3/uL (ref 150–440)
RDW: 12.8 % (ref 11.5–14.5)

## 2012-05-07 LAB — BASIC METABOLIC PANEL
BUN: 12 mg/dL (ref 7–18)
Chloride: 100 mmol/L (ref 98–107)
Creatinine: 0.73 mg/dL (ref 0.60–1.30)
EGFR (African American): >60
Glucose: 98 mg/dL (ref 65–99)
Osmolality: 266 (ref 275–301)
Potassium: 3.7 mmol/L (ref 3.5–5.1)
Sodium: 133 mmol/L — ABNORMAL LOW (ref 136–145)

## 2012-05-07 LAB — URINE CULTURE

## 2012-05-08 LAB — CBC WITH DIFFERENTIAL/PLATELET
Basophil #: 0 10*3/uL (ref 0.0–0.1)
Eosinophil %: 3.9 %
HCT: 33.6 % — ABNORMAL LOW (ref 35.0–47.0)
HGB: 11.3 g/dL — ABNORMAL LOW (ref 12.0–16.0)
Lymphocyte #: 1.7 10*3/uL (ref 1.0–3.6)
Monocyte #: 1 x10 3/mm — ABNORMAL HIGH (ref 0.2–0.9)
Monocyte %: 11.9 %
Neutrophil #: 5.4 10*3/uL (ref 1.4–6.5)
RBC: 3.74 10*6/uL — ABNORMAL LOW (ref 3.80–5.20)
WBC: 8.5 10*3/uL (ref 3.6–11.0)

## 2012-05-09 DIAGNOSIS — G9340 Encephalopathy, unspecified: Secondary | ICD-10-CM | POA: Diagnosis not present

## 2012-05-09 DIAGNOSIS — F411 Generalized anxiety disorder: Secondary | ICD-10-CM | POA: Diagnosis not present

## 2012-05-09 DIAGNOSIS — R269 Unspecified abnormalities of gait and mobility: Secondary | ICD-10-CM | POA: Diagnosis not present

## 2012-05-09 DIAGNOSIS — G4730 Sleep apnea, unspecified: Secondary | ICD-10-CM | POA: Diagnosis not present

## 2012-05-09 DIAGNOSIS — E871 Hypo-osmolality and hyponatremia: Secondary | ICD-10-CM | POA: Diagnosis not present

## 2012-05-09 DIAGNOSIS — G4710 Hypersomnia, unspecified: Secondary | ICD-10-CM | POA: Diagnosis not present

## 2012-05-09 DIAGNOSIS — H409 Unspecified glaucoma: Secondary | ICD-10-CM | POA: Diagnosis not present

## 2012-05-09 DIAGNOSIS — S72019A Unspecified intracapsular fracture of unspecified femur, initial encounter for closed fracture: Secondary | ICD-10-CM | POA: Diagnosis not present

## 2012-05-09 DIAGNOSIS — Z4789 Encounter for other orthopedic aftercare: Secondary | ICD-10-CM | POA: Diagnosis not present

## 2012-05-09 DIAGNOSIS — Z5189 Encounter for other specified aftercare: Secondary | ICD-10-CM | POA: Diagnosis not present

## 2012-05-09 DIAGNOSIS — Z9181 History of falling: Secondary | ICD-10-CM | POA: Diagnosis not present

## 2012-05-09 DIAGNOSIS — S72009D Fracture of unspecified part of neck of unspecified femur, subsequent encounter for closed fracture with routine healing: Secondary | ICD-10-CM | POA: Diagnosis not present

## 2012-05-09 DIAGNOSIS — I10 Essential (primary) hypertension: Secondary | ICD-10-CM | POA: Diagnosis not present

## 2012-05-09 DIAGNOSIS — F3289 Other specified depressive episodes: Secondary | ICD-10-CM | POA: Diagnosis not present

## 2012-05-09 DIAGNOSIS — M810 Age-related osteoporosis without current pathological fracture: Secondary | ICD-10-CM | POA: Diagnosis not present

## 2012-05-09 DIAGNOSIS — F329 Major depressive disorder, single episode, unspecified: Secondary | ICD-10-CM | POA: Diagnosis not present

## 2012-05-09 DIAGNOSIS — R6889 Other general symptoms and signs: Secondary | ICD-10-CM | POA: Diagnosis not present

## 2012-05-09 DIAGNOSIS — M6281 Muscle weakness (generalized): Secondary | ICD-10-CM | POA: Diagnosis not present

## 2012-05-09 DIAGNOSIS — Z01818 Encounter for other preprocedural examination: Secondary | ICD-10-CM | POA: Diagnosis not present

## 2012-05-09 LAB — CBC WITH DIFFERENTIAL/PLATELET
Basophil #: 0 10*3/uL (ref 0.0–0.1)
Basophil %: 0.7 %
Eosinophil #: 0.5 10*3/uL (ref 0.0–0.7)
Eosinophil %: 6.7 %
HCT: 33.6 % — ABNORMAL LOW (ref 35.0–47.0)
HGB: 11.2 g/dL — ABNORMAL LOW (ref 12.0–16.0)
Lymphocyte %: 29 %
MCH: 29.6 pg (ref 26.0–34.0)
MCHC: 33.2 g/dL (ref 32.0–36.0)
Monocyte #: 0.8 x10 3/mm (ref 0.2–0.9)
Neutrophil #: 3.6 10*3/uL (ref 1.4–6.5)
Platelet: 305 10*3/uL (ref 150–440)
RBC: 3.76 10*6/uL — ABNORMAL LOW (ref 3.80–5.20)
RDW: 12.8 % (ref 11.5–14.5)
WBC: 6.9 10*3/uL (ref 3.6–11.0)

## 2012-05-10 ENCOUNTER — Encounter: Payer: Self-pay | Admitting: Internal Medicine

## 2012-05-10 DIAGNOSIS — I10 Essential (primary) hypertension: Secondary | ICD-10-CM | POA: Diagnosis not present

## 2012-05-10 DIAGNOSIS — G9340 Encephalopathy, unspecified: Secondary | ICD-10-CM | POA: Diagnosis not present

## 2012-05-10 DIAGNOSIS — M810 Age-related osteoporosis without current pathological fracture: Secondary | ICD-10-CM | POA: Diagnosis not present

## 2012-05-18 DIAGNOSIS — S72019A Unspecified intracapsular fracture of unspecified femur, initial encounter for closed fracture: Secondary | ICD-10-CM | POA: Diagnosis not present

## 2012-05-19 DIAGNOSIS — F329 Major depressive disorder, single episode, unspecified: Secondary | ICD-10-CM | POA: Diagnosis not present

## 2012-05-19 DIAGNOSIS — F3289 Other specified depressive episodes: Secondary | ICD-10-CM | POA: Diagnosis not present

## 2012-05-19 DIAGNOSIS — F411 Generalized anxiety disorder: Secondary | ICD-10-CM | POA: Diagnosis not present

## 2012-05-22 DIAGNOSIS — G4710 Hypersomnia, unspecified: Secondary | ICD-10-CM | POA: Diagnosis not present

## 2012-05-22 DIAGNOSIS — G4730 Sleep apnea, unspecified: Secondary | ICD-10-CM | POA: Diagnosis not present

## 2012-05-25 ENCOUNTER — Encounter: Payer: Self-pay | Admitting: Internal Medicine

## 2012-06-21 ENCOUNTER — Ambulatory Visit: Payer: Medicare Other | Admitting: Internal Medicine

## 2012-06-25 ENCOUNTER — Encounter: Payer: Self-pay | Admitting: Internal Medicine

## 2012-06-25 DIAGNOSIS — Z5189 Encounter for other specified aftercare: Secondary | ICD-10-CM | POA: Diagnosis not present

## 2012-06-25 DIAGNOSIS — Z4789 Encounter for other orthopedic aftercare: Secondary | ICD-10-CM | POA: Diagnosis not present

## 2012-06-25 DIAGNOSIS — S72009D Fracture of unspecified part of neck of unspecified femur, subsequent encounter for closed fracture with routine healing: Secondary | ICD-10-CM | POA: Diagnosis not present

## 2012-06-25 DIAGNOSIS — R269 Unspecified abnormalities of gait and mobility: Secondary | ICD-10-CM | POA: Diagnosis not present

## 2012-06-25 DIAGNOSIS — I10 Essential (primary) hypertension: Secondary | ICD-10-CM | POA: Diagnosis not present

## 2012-06-25 DIAGNOSIS — S72019A Unspecified intracapsular fracture of unspecified femur, initial encounter for closed fracture: Secondary | ICD-10-CM | POA: Diagnosis not present

## 2012-06-25 DIAGNOSIS — H409 Unspecified glaucoma: Secondary | ICD-10-CM | POA: Diagnosis not present

## 2012-06-25 DIAGNOSIS — M6281 Muscle weakness (generalized): Secondary | ICD-10-CM | POA: Diagnosis not present

## 2012-06-25 DIAGNOSIS — Z9181 History of falling: Secondary | ICD-10-CM | POA: Diagnosis not present

## 2012-06-25 DIAGNOSIS — M810 Age-related osteoporosis without current pathological fracture: Secondary | ICD-10-CM | POA: Diagnosis not present

## 2012-06-26 ENCOUNTER — Telehealth: Payer: Self-pay | Admitting: Internal Medicine

## 2012-06-26 DIAGNOSIS — S72019A Unspecified intracapsular fracture of unspecified femur, initial encounter for closed fracture: Secondary | ICD-10-CM | POA: Diagnosis not present

## 2012-06-26 NOTE — Telephone Encounter (Signed)
Left message for pt to call office. Dr Lorin Picket wanted her to r/s her appointment to hospital follow.  Please let her talk to nurse dr Lorin Picket also wanted to make sure she is doing ok

## 2012-06-28 NOTE — Telephone Encounter (Signed)
Left message for pt to call office

## 2012-06-29 NOTE — Telephone Encounter (Signed)
Left message for pt to call office

## 2012-06-30 NOTE — Telephone Encounter (Signed)
Dr Lorin Picket I wanted to let you know i have left several message for pt to call the office.

## 2012-06-30 NOTE — Telephone Encounter (Signed)
Left message for pt to call office

## 2012-07-06 ENCOUNTER — Telehealth: Payer: Self-pay | Admitting: *Deleted

## 2012-07-06 NOTE — Telephone Encounter (Signed)
FYI: Pt scheduled hospital f/u on Mon 6/30 @ 2pm. States that she is just getting home from [place], & doing well. Requested records from [facility]

## 2012-07-08 DIAGNOSIS — I10 Essential (primary) hypertension: Secondary | ICD-10-CM | POA: Diagnosis not present

## 2012-07-08 DIAGNOSIS — Z5189 Encounter for other specified aftercare: Secondary | ICD-10-CM | POA: Diagnosis not present

## 2012-07-08 DIAGNOSIS — M810 Age-related osteoporosis without current pathological fracture: Secondary | ICD-10-CM | POA: Diagnosis not present

## 2012-07-08 DIAGNOSIS — F3289 Other specified depressive episodes: Secondary | ICD-10-CM | POA: Diagnosis not present

## 2012-07-08 DIAGNOSIS — F329 Major depressive disorder, single episode, unspecified: Secondary | ICD-10-CM | POA: Diagnosis not present

## 2012-07-08 DIAGNOSIS — S72009D Fracture of unspecified part of neck of unspecified femur, subsequent encounter for closed fracture with routine healing: Secondary | ICD-10-CM | POA: Diagnosis not present

## 2012-07-09 DIAGNOSIS — S72009D Fracture of unspecified part of neck of unspecified femur, subsequent encounter for closed fracture with routine healing: Secondary | ICD-10-CM | POA: Diagnosis not present

## 2012-07-09 DIAGNOSIS — I10 Essential (primary) hypertension: Secondary | ICD-10-CM | POA: Diagnosis not present

## 2012-07-09 DIAGNOSIS — Z5189 Encounter for other specified aftercare: Secondary | ICD-10-CM | POA: Diagnosis not present

## 2012-07-09 DIAGNOSIS — M810 Age-related osteoporosis without current pathological fracture: Secondary | ICD-10-CM | POA: Diagnosis not present

## 2012-07-09 DIAGNOSIS — F3289 Other specified depressive episodes: Secondary | ICD-10-CM | POA: Diagnosis not present

## 2012-07-09 DIAGNOSIS — F329 Major depressive disorder, single episode, unspecified: Secondary | ICD-10-CM | POA: Diagnosis not present

## 2012-07-11 DIAGNOSIS — I10 Essential (primary) hypertension: Secondary | ICD-10-CM | POA: Diagnosis not present

## 2012-07-11 DIAGNOSIS — S72009D Fracture of unspecified part of neck of unspecified femur, subsequent encounter for closed fracture with routine healing: Secondary | ICD-10-CM | POA: Diagnosis not present

## 2012-07-11 DIAGNOSIS — F329 Major depressive disorder, single episode, unspecified: Secondary | ICD-10-CM | POA: Diagnosis not present

## 2012-07-11 DIAGNOSIS — Z5189 Encounter for other specified aftercare: Secondary | ICD-10-CM | POA: Diagnosis not present

## 2012-07-11 DIAGNOSIS — M810 Age-related osteoporosis without current pathological fracture: Secondary | ICD-10-CM | POA: Diagnosis not present

## 2012-07-11 DIAGNOSIS — F3289 Other specified depressive episodes: Secondary | ICD-10-CM | POA: Diagnosis not present

## 2012-07-13 DIAGNOSIS — Z5189 Encounter for other specified aftercare: Secondary | ICD-10-CM | POA: Diagnosis not present

## 2012-07-13 DIAGNOSIS — M810 Age-related osteoporosis without current pathological fracture: Secondary | ICD-10-CM | POA: Diagnosis not present

## 2012-07-13 DIAGNOSIS — I10 Essential (primary) hypertension: Secondary | ICD-10-CM | POA: Diagnosis not present

## 2012-07-13 DIAGNOSIS — F3289 Other specified depressive episodes: Secondary | ICD-10-CM | POA: Diagnosis not present

## 2012-07-13 DIAGNOSIS — F329 Major depressive disorder, single episode, unspecified: Secondary | ICD-10-CM | POA: Diagnosis not present

## 2012-07-13 DIAGNOSIS — S72009D Fracture of unspecified part of neck of unspecified femur, subsequent encounter for closed fracture with routine healing: Secondary | ICD-10-CM | POA: Diagnosis not present

## 2012-07-18 DIAGNOSIS — F3289 Other specified depressive episodes: Secondary | ICD-10-CM | POA: Diagnosis not present

## 2012-07-18 DIAGNOSIS — I10 Essential (primary) hypertension: Secondary | ICD-10-CM | POA: Diagnosis not present

## 2012-07-18 DIAGNOSIS — S72009D Fracture of unspecified part of neck of unspecified femur, subsequent encounter for closed fracture with routine healing: Secondary | ICD-10-CM | POA: Diagnosis not present

## 2012-07-18 DIAGNOSIS — Z5189 Encounter for other specified aftercare: Secondary | ICD-10-CM | POA: Diagnosis not present

## 2012-07-18 DIAGNOSIS — F329 Major depressive disorder, single episode, unspecified: Secondary | ICD-10-CM | POA: Diagnosis not present

## 2012-07-18 DIAGNOSIS — M810 Age-related osteoporosis without current pathological fracture: Secondary | ICD-10-CM | POA: Diagnosis not present

## 2012-07-20 DIAGNOSIS — M810 Age-related osteoporosis without current pathological fracture: Secondary | ICD-10-CM | POA: Diagnosis not present

## 2012-07-20 DIAGNOSIS — I10 Essential (primary) hypertension: Secondary | ICD-10-CM | POA: Diagnosis not present

## 2012-07-20 DIAGNOSIS — F329 Major depressive disorder, single episode, unspecified: Secondary | ICD-10-CM | POA: Diagnosis not present

## 2012-07-20 DIAGNOSIS — S72009D Fracture of unspecified part of neck of unspecified femur, subsequent encounter for closed fracture with routine healing: Secondary | ICD-10-CM | POA: Diagnosis not present

## 2012-07-20 DIAGNOSIS — Z5189 Encounter for other specified aftercare: Secondary | ICD-10-CM | POA: Diagnosis not present

## 2012-07-20 DIAGNOSIS — F3289 Other specified depressive episodes: Secondary | ICD-10-CM | POA: Diagnosis not present

## 2012-07-24 ENCOUNTER — Encounter: Payer: Self-pay | Admitting: Internal Medicine

## 2012-07-24 ENCOUNTER — Ambulatory Visit (INDEPENDENT_AMBULATORY_CARE_PROVIDER_SITE_OTHER): Payer: Medicare Other | Admitting: Internal Medicine

## 2012-07-24 VITALS — BP 140/70 | HR 97 | Temp 98.3°F | Ht 60.75 in | Wt 135.5 lb

## 2012-07-24 DIAGNOSIS — R7309 Other abnormal glucose: Secondary | ICD-10-CM | POA: Diagnosis not present

## 2012-07-24 DIAGNOSIS — F329 Major depressive disorder, single episode, unspecified: Secondary | ICD-10-CM

## 2012-07-24 DIAGNOSIS — E7800 Pure hypercholesterolemia, unspecified: Secondary | ICD-10-CM | POA: Diagnosis not present

## 2012-07-24 DIAGNOSIS — F3289 Other specified depressive episodes: Secondary | ICD-10-CM

## 2012-07-24 DIAGNOSIS — I10 Essential (primary) hypertension: Secondary | ICD-10-CM | POA: Diagnosis not present

## 2012-07-24 DIAGNOSIS — R9389 Abnormal findings on diagnostic imaging of other specified body structures: Secondary | ICD-10-CM

## 2012-07-24 DIAGNOSIS — E039 Hypothyroidism, unspecified: Secondary | ICD-10-CM

## 2012-07-24 DIAGNOSIS — F32A Depression, unspecified: Secondary | ICD-10-CM

## 2012-07-24 DIAGNOSIS — R739 Hyperglycemia, unspecified: Secondary | ICD-10-CM

## 2012-07-24 DIAGNOSIS — E038 Other specified hypothyroidism: Secondary | ICD-10-CM

## 2012-07-24 NOTE — Assessment & Plan Note (Signed)
Follow tsh, free T4 and free T3.    

## 2012-07-24 NOTE — Assessment & Plan Note (Signed)
Stable.  Followed by Dr Lenis Noon.

## 2012-07-24 NOTE — Assessment & Plan Note (Signed)
Blood pressure as outlined.  Follow.  Same medication regimen.  Follow metabolic panel.     

## 2012-07-24 NOTE — Assessment & Plan Note (Signed)
Low carb diet.  Follow a1c.   

## 2012-07-24 NOTE — Assessment & Plan Note (Signed)
Low cholesterol diet.  Follow lipid panel.    

## 2012-07-24 NOTE — Assessment & Plan Note (Signed)
CT as outlined.  Declines any further w/up.    

## 2012-07-24 NOTE — Progress Notes (Signed)
  Subjective:    Patient ID: Sarah [name], female    DOB: [birth year], 79 y.o.   MRN: [id number]  HPI 79 year old female with past history of hypertension and hypercholesterolemia who comes in today for a scheduled rehab follow up.  She was recently admitted with a right non displaced femoral neck fracture.  Is s/p pinning 05/06/12.  She required rehab post hospitalization.  Was just discharged to home two weeks ago.  Had home physical therapy until last week.  Doing well.  Able to ambulate around her house.  Not going up stairs.  Feels good.  Eating and drinking well.  Bowels stable.  Back on her regular medications.  While in the hospital, she had a cxr and subsequent CT chest.  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  Indeterminate 3mm pulmonary nodules.  Discussed this with her today.  She declines further w/up.  Reports swallowing ok.  No sob or cough.     Past Medical History  Diagnosis Date  . Arthritis   . Depression   . Glaucoma   . Hypercholesterolemia   . Subclinical hypothyroidism   . Hypertension     Current Outpatient Prescriptions on File Prior to Visit  Medication Sig Dispense Refill  . amLODipine (NORVASC) 5 MG tablet Take 1 tablet (5 mg total) by mouth daily.  30 tablet  5  . fluticasone (FLONASE) 50 MCG/ACT nasal spray Place 2 sprays into the nose daily.  16 g  2  . Multiple Vitamins-Minerals (CENTRUM SILVER PO) Take 1 tablet by mouth daily.      Marland Kitchen venlafaxine XR (EFFEXOR-XR) 150 MG 24 hr capsule Take three tablets by mouth daily       No current facility-administered medications on file prior to visit.    Review of Systems Patient denies any headache, lightheadedness or dizziness.  No chest pain, tightness or palpitations. No increased  shortness of breath, cough or congestion.  No acid reflux, dysphagia or odynophagia.  No abdominal pain.  Bowels stable.  Doing well s/p surgery.  Moving around well.        Objective:   Physical Exam  Filed Vitals:   07/24/12 1357  BP: 140/70  Pulse: 97  Temp: 98.3 F (38.64 C)   79 year old female in no acute distress.   HEENT:  Nares- clear.  Oropharynx - without lesions. NECK:  Supple.  Nontender.  No audible bruit.  HEART:  Appears to be regular. LUNGS:  No crackles or wheezing audible.  Respirations even and unlabored.  RADIAL PULSE:  Equal bilaterally.  ABDOMEN:  Soft, nontender.  Bowel sounds present and normal.  No audible abdominal bruit.    EXTREMITIES:  No increased edema present.  DP pulses palpable and equal bilaterally.          Assessment & Plan:  MSK.  See above. Doing well s/p surgery for femoral neck fracture.  Continues to follow up with ortho.  HEALTH MAINTENANCE.  Physical 02/21/12.  She declines mammogram, colonoscopy, bone density and breast/pelvic exam.

## 2012-08-02 DIAGNOSIS — F332 Major depressive disorder, recurrent severe without psychotic features: Secondary | ICD-10-CM | POA: Diagnosis not present

## 2012-08-21 DIAGNOSIS — S72019A Unspecified intracapsular fracture of unspecified femur, initial encounter for closed fracture: Secondary | ICD-10-CM | POA: Diagnosis not present

## 2012-10-13 DIAGNOSIS — H4010X0 Unspecified open-angle glaucoma, stage unspecified: Secondary | ICD-10-CM | POA: Diagnosis not present

## 2012-10-24 ENCOUNTER — Encounter: Payer: Self-pay | Admitting: Internal Medicine

## 2012-10-24 ENCOUNTER — Ambulatory Visit (INDEPENDENT_AMBULATORY_CARE_PROVIDER_SITE_OTHER): Payer: Medicare Other | Admitting: Internal Medicine

## 2012-10-24 VITALS — BP 130/80 | HR 70 | Temp 98.2°F | Ht 60.75 in | Wt 140.5 lb

## 2012-10-24 DIAGNOSIS — I10 Essential (primary) hypertension: Secondary | ICD-10-CM

## 2012-10-24 DIAGNOSIS — D649 Anemia, unspecified: Secondary | ICD-10-CM

## 2012-10-24 DIAGNOSIS — E038 Other specified hypothyroidism: Secondary | ICD-10-CM | POA: Diagnosis not present

## 2012-10-24 DIAGNOSIS — F329 Major depressive disorder, single episode, unspecified: Secondary | ICD-10-CM

## 2012-10-24 DIAGNOSIS — R7309 Other abnormal glucose: Secondary | ICD-10-CM

## 2012-10-24 DIAGNOSIS — F3289 Other specified depressive episodes: Secondary | ICD-10-CM

## 2012-10-24 DIAGNOSIS — E7800 Pure hypercholesterolemia, unspecified: Secondary | ICD-10-CM | POA: Diagnosis not present

## 2012-10-24 DIAGNOSIS — R739 Hyperglycemia, unspecified: Secondary | ICD-10-CM

## 2012-10-24 DIAGNOSIS — F32A Depression, unspecified: Secondary | ICD-10-CM

## 2012-10-24 DIAGNOSIS — R9389 Abnormal findings on diagnostic imaging of other specified body structures: Secondary | ICD-10-CM

## 2012-10-24 DIAGNOSIS — E039 Hypothyroidism, unspecified: Secondary | ICD-10-CM

## 2012-10-24 NOTE — Assessment & Plan Note (Signed)
Stable.  Followed by Dr Lenis Noon.

## 2012-10-24 NOTE — Assessment & Plan Note (Signed)
Low cholesterol diet.  Follow lipid panel.    

## 2012-10-24 NOTE — Assessment & Plan Note (Signed)
Blood pressure doing well.  Follow.  Same medication regimen.  Follow metabolic panel.   

## 2012-10-24 NOTE — Assessment & Plan Note (Signed)
Follow tsh, free T4 and free T3.    

## 2012-10-24 NOTE — Assessment & Plan Note (Signed)
Low carb diet.  Follow a1c.   

## 2012-10-24 NOTE — Assessment & Plan Note (Signed)
CT as outlined.  Declines any further w/up.    

## 2012-10-24 NOTE — Progress Notes (Signed)
  Subjective:    Patient ID: Sarah [name], female    DOB: August 18, 1923, 79 y.o.   MRN: [id number]  HPI 79 year old female with past history of hypertension and hypercholesterolemia who comes in today for a scheduled follow up.  She was recently admitted with a right non displaced femoral neck fracture.  Is s/p pinning 05/06/12.  She required rehab post hospitalization.  Had home physical therapy.  Doing well. Able to ambulate around her house.  Feels good.  Eating and drinking well.  Bowels stable.  While in the hospital, she had a cxr and subsequent CT chest.  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  Indeterminate 3mm pulmonary nodules.  Discussed this with her today. She declines further w/up.  Reports swallowing ok.  No sob or cough.     Past Medical History  Diagnosis Date  . Arthritis   . Depression   . Glaucoma   . Hypercholesterolemia   . Subclinical hypothyroidism   . Hypertension     Current Outpatient Prescriptions on File Prior to Visit  Medication Sig Dispense Refill  . amLODipine (NORVASC) 5 MG tablet Take 1 tablet (5 mg total) by mouth daily.  30 tablet  5  . fluticasone (FLONASE) 50 MCG/ACT nasal spray Place 2 sprays into the nose daily.  16 g  2  . Multiple Vitamins-Minerals (CENTRUM SILVER PO) Take 1 tablet by mouth daily.      Marland Kitchen venlafaxine XR (EFFEXOR-XR) 150 MG 24 hr capsule Take three tablets by mouth daily       No current facility-administered medications on file prior to visit.    Review of Systems Patient denies any headache, lightheadedness or dizziness.  No chest pain, tightness or palpitations. No increased shortness of breath, cough or congestion.  No acid reflux, dysphagia or odynophagia.  No abdominal pain.  Bowels stable.   Doing well s/p surgery.  Moving around well.        Objective:   Physical Exam  Filed Vitals:   10/24/12 1122  BP: 130/80  Pulse: 70  Temp: 98.2 F (36.65 C)   79 year old female in no acute distress.   HEENT:  Nares- clear.  Oropharynx - without lesions. NECK:  Supple.  Nontender.  No audible bruit.  HEART:  Appears to be regular. LUNGS:  No crackles or wheezing audible.  Respirations even and unlabored.  RADIAL PULSE:  Equal bilaterally.  ABDOMEN:  Soft, nontender.  Bowel sounds present and normal.  No audible abdominal bruit.    EXTREMITIES:  No increased edema present.  DP pulses palpable and equal bilaterally.          Assessment & Plan:  MSK.  See above. Doing well s/p surgery for femoral neck fracture.  Continues to follow up with ortho.  HEALTH MAINTENANCE.  Physical 02/21/12.  She declines mammogram, colonoscopy, bone density and breast/pelvic exam.

## 2012-11-02 ENCOUNTER — Other Ambulatory Visit: Payer: Medicare Other

## 2012-11-22 DIAGNOSIS — F332 Major depressive disorder, recurrent severe without psychotic features: Secondary | ICD-10-CM | POA: Diagnosis not present

## 2012-11-23 ENCOUNTER — Other Ambulatory Visit (INDEPENDENT_AMBULATORY_CARE_PROVIDER_SITE_OTHER): Payer: Medicare Other

## 2012-11-23 DIAGNOSIS — E7800 Pure hypercholesterolemia, unspecified: Secondary | ICD-10-CM | POA: Diagnosis not present

## 2012-11-23 DIAGNOSIS — I10 Essential (primary) hypertension: Secondary | ICD-10-CM | POA: Diagnosis not present

## 2012-11-23 DIAGNOSIS — D649 Anemia, unspecified: Secondary | ICD-10-CM

## 2012-11-23 DIAGNOSIS — E038 Other specified hypothyroidism: Secondary | ICD-10-CM

## 2012-11-23 DIAGNOSIS — E039 Hypothyroidism, unspecified: Secondary | ICD-10-CM

## 2012-11-23 LAB — COMPREHENSIVE METABOLIC PANEL
Albumin: 4.2 g/dL (ref 3.5–5.2)
Alkaline Phosphatase: 113 U/L (ref 39–117)
BUN: 11 mg/dL (ref 6–23)
CO2: 30 mEq/L (ref 19–32)
Chloride: 102 mEq/L (ref 96–112)
Glucose, Bld: 109 mg/dL — ABNORMAL HIGH (ref 70–99)
Potassium: 4.9 mEq/L (ref 3.5–5.1)
Total Bilirubin: 0.7 mg/dL (ref 0.3–1.2)

## 2012-11-23 LAB — CBC WITH DIFFERENTIAL/PLATELET
Eosinophils Relative: 4.6 % (ref 0.0–5.0)
MCV: 88.3 fl (ref 78.0–100.0)
Monocytes Absolute: 0.5 10*3/uL (ref 0.1–1.0)
Neutrophils Relative %: 53.4 % (ref 43.0–77.0)
Platelets: 293 10*3/uL (ref 150.0–400.0)
WBC: 5.1 10*3/uL (ref 4.5–10.5)

## 2012-11-23 LAB — FERRITIN: Ferritin: 90.9 ng/mL (ref 10.0–291.0)

## 2012-11-23 LAB — T4, FREE: Free T4: 0.94 ng/dL (ref 0.60–1.60)

## 2012-11-23 LAB — LIPID PANEL
Cholesterol: 238 mg/dL — ABNORMAL HIGH (ref 0–200)
VLDL: 51.4 mg/dL — ABNORMAL HIGH (ref 0.0–40.0)

## 2012-11-23 LAB — T3, FREE: T3, Free: 3 pg/mL (ref 2.3–4.2)

## 2012-11-24 ENCOUNTER — Encounter: Payer: Self-pay | Admitting: *Deleted

## 2012-11-24 ENCOUNTER — Other Ambulatory Visit: Payer: Self-pay | Admitting: Internal Medicine

## 2012-11-24 ENCOUNTER — Other Ambulatory Visit (INDEPENDENT_AMBULATORY_CARE_PROVIDER_SITE_OTHER): Payer: Medicare Other

## 2012-11-24 DIAGNOSIS — R7309 Other abnormal glucose: Secondary | ICD-10-CM | POA: Diagnosis not present

## 2012-11-24 DIAGNOSIS — R739 Hyperglycemia, unspecified: Secondary | ICD-10-CM

## 2012-11-24 NOTE — Progress Notes (Signed)
Order placed for a1c

## 2012-12-06 DIAGNOSIS — F332 Major depressive disorder, recurrent severe without psychotic features: Secondary | ICD-10-CM | POA: Diagnosis not present

## 2013-01-08 ENCOUNTER — Telehealth: Payer: Self-pay | Admitting: Internal Medicine

## 2013-01-08 NOTE — Telephone Encounter (Signed)
I am glad to hear she is better.  The tylenol cold and flu can elevate blood pressure.  How is her blood pressure doing?  What symptoms is she having now?  May be able to adjust otc medication some pending symptoms.  Stay hydrated.  If needs eval, let me know.

## 2013-01-08 NOTE — Telephone Encounter (Signed)
If just cough (occasional yellow mucus), then can try robitussin DM instead (if no problems with robitussin or mucinex).  If persistent problems, will need evaluation.

## 2013-01-08 NOTE — Telephone Encounter (Signed)
Pt caregiver calling, Dorita Sciara.  States pt has a bad cold and she spoke with pharmacist and has been giving pt Tylenol cold and flu in liquid form.  States that is helping.  No fever.  Asking if it is okay to continue giving this to her.

## 2013-01-08 NOTE — Telephone Encounter (Signed)
Caregiver notified & states that her main sx's is coughing up yellow mucous. Will check her BP tomorrow & give [place] an update if her BP is increased. Also notified her to let us know if she is no better in a few days.

## 2013-01-08 NOTE — Telephone Encounter (Signed)
Please advise 

## 2013-01-09 ENCOUNTER — Ambulatory Visit (INDEPENDENT_AMBULATORY_CARE_PROVIDER_SITE_OTHER): Payer: Medicare Other | Admitting: Internal Medicine

## 2013-01-09 ENCOUNTER — Encounter: Payer: Self-pay | Admitting: Internal Medicine

## 2013-01-09 VITALS — BP 118/64 | HR 81 | Temp 98.4°F | Ht 60.75 in | Wt 137.2 lb

## 2013-01-09 DIAGNOSIS — F3289 Other specified depressive episodes: Secondary | ICD-10-CM

## 2013-01-09 DIAGNOSIS — J069 Acute upper respiratory infection, unspecified: Secondary | ICD-10-CM

## 2013-01-09 DIAGNOSIS — I10 Essential (primary) hypertension: Secondary | ICD-10-CM | POA: Diagnosis not present

## 2013-01-09 DIAGNOSIS — F329 Major depressive disorder, single episode, unspecified: Secondary | ICD-10-CM

## 2013-01-09 DIAGNOSIS — F32A Depression, unspecified: Secondary | ICD-10-CM

## 2013-01-09 MED ORDER — AZITHROMYCIN 250 MG PO TABS: ORAL_TABLET | ORAL | Status: DC

## 2013-01-09 MED ORDER — FLUTICASONE PROPIONATE 50 MCG/ACT NA SUSP: 2.0000 | Freq: Every day | NASAL | Status: AC

## 2013-01-09 NOTE — Telephone Encounter (Signed)
I can work her in at 1:30 today.

## 2013-01-09 NOTE — Telephone Encounter (Signed)
Pt calling.  States she still has a cold, her BP is up, and she wants to see Dr. Lorin Picket.  Refused triage. States Dr. Lorin Picket will see her.  Please advise.

## 2013-01-09 NOTE — Telephone Encounter (Signed)
Please advise-pt was informed to try the Robitussin DM instead but would prefer to see Dr. Lorin Picket today. (Copy placed on counter also)

## 2013-01-09 NOTE — Patient Instructions (Signed)
Saline nasal spray - flush nose at least 2-3x/day.  Flonase nasal spray - 2 sprays each nostril one time per day.  Do this in the evening.  Robitussin DM as directed.  Rest.  Fluids.

## 2013-01-09 NOTE — Telephone Encounter (Signed)
Pt.notified

## 2013-01-09 NOTE — Progress Notes (Signed)
Pre-visit discussion using our clinic review tool. No additional management support is needed unless otherwise documented below in the visit note.  

## 2013-01-10 DIAGNOSIS — S72019A Unspecified intracapsular fracture of unspecified femur, initial encounter for closed fracture: Secondary | ICD-10-CM | POA: Diagnosis not present

## 2013-01-14 ENCOUNTER — Encounter: Payer: Self-pay | Admitting: Internal Medicine

## 2013-01-14 NOTE — Assessment & Plan Note (Signed)
Increased stress with the recent death of her daughter.  Feels she is coping relatively well.  Followed by Dr Levine.   

## 2013-01-14 NOTE — Assessment & Plan Note (Signed)
Symptoms as outlined.  Treat with Zpak as directed.  Robitussin as directed.  Flonase nasal spray and saline nasal spray as directed.  Follow.

## 2013-01-14 NOTE — Assessment & Plan Note (Signed)
Blood pressure as outlined.  Stop the otc cold medication.  Follow.

## 2013-01-14 NOTE — Progress Notes (Signed)
  Subjective:    Patient ID: Sarah [name], female    DOB: 06-Nov-1923, 79 y.o.   MRN: [id number]  Cough  79 year old female with past history of hypertension and hypercholesterolemia who comes in today as a work in with concerns regarding increased cough and congestion.   She was recently admitted with a right non displaced femoral neck fracture.  Is s/p pinning 05/06/12.  Overall doing well from this.  She does report that starting five days ago, she developed some increased sinus pressure and ears felt stopped up.  Sore throat initially.  Better now.  Some hoarseness.  Now with cough - productive.  She is drinking fluids.  No vomiting or diarrhea.  Taking otc cold medication.  Was concerned blood pressure elevated.  No chest pain or sob.      Past Medical History  Diagnosis Date  . Arthritis   . Depression   . Glaucoma   . Hypercholesterolemia   . Subclinical hypothyroidism   . Hypertension     Current Outpatient Prescriptions on File Prior to Visit  Medication Sig Dispense Refill  . amLODipine (NORVASC) 5 MG tablet Take 1 tablet (5 mg total) by mouth daily.  30 tablet  5  . Multiple Vitamins-Minerals (CENTRUM SILVER PO) Take 1 tablet by mouth daily.      Marland Kitchen venlafaxine XR (EFFEXOR-XR) 150 MG 24 hr capsule Take three tablets by mouth daily       No current facility-administered medications on file prior to visit.    Review of Systems  Respiratory: Positive for cough.   Patient denies any headache, lightheadedness or dizziness.  Sinus pressure and ear fullness as outlined.  Sore throat is better.  No chest pain, tightness or palpitations. No increased shortness of breath.  Cough productive.  No acid reflux, dysphagia or odynophagia.  No abdominal pain.  Bowels stable. Drinking fluids.  No vomiting.         Objective:   Physical Exam  Filed Vitals:   01/09/13 1353  BP: 118/64  Pulse: 81  Temp: 98.4 F (36.9 C)   Blood pressure recheck:  23/64  79 year old female in no  acute distress.   HEENT:  Nares- erythematous turbinates.  Oropharynx - without lesions.   NECK:  Supple.  Nontender.  No audible bruit.  HEART:  Appears to be regular. LUNGS:  No crackles or wheezing audible.  Respirations even and unlabored.  RADIAL PULSE:  Equal bilaterally.          Assessment & Plan:  MSK.  See above. Doing well s/p surgery for femoral neck fracture.  Continues to follow up with ortho.  HEALTH MAINTENANCE.  Physical 02/21/12.  She declines mammogram, colonoscopy, bone density and breast/pelvic exam.

## 2013-02-12 ENCOUNTER — Ambulatory Visit: Payer: Medicare Other | Admitting: Internal Medicine

## 2013-02-19 ENCOUNTER — Other Ambulatory Visit: Payer: Self-pay | Admitting: *Deleted

## 2013-02-19 MED ORDER — AMLODIPINE BESYLATE 5 MG PO TABS: 5.0000 mg | ORAL_TABLET | Freq: Every day | ORAL | Status: DC

## 2013-03-09 ENCOUNTER — Encounter: Payer: Self-pay | Admitting: Internal Medicine

## 2013-03-09 ENCOUNTER — Ambulatory Visit (INDEPENDENT_AMBULATORY_CARE_PROVIDER_SITE_OTHER): Payer: Medicare Other | Admitting: Internal Medicine

## 2013-03-09 VITALS — BP 138/80 | HR 80 | Temp 98.2°F | Ht 61.0 in | Wt 140.5 lb

## 2013-03-09 DIAGNOSIS — R9389 Abnormal findings on diagnostic imaging of other specified body structures: Secondary | ICD-10-CM

## 2013-03-09 DIAGNOSIS — R739 Hyperglycemia, unspecified: Secondary | ICD-10-CM

## 2013-03-09 DIAGNOSIS — R7309 Other abnormal glucose: Secondary | ICD-10-CM

## 2013-03-09 DIAGNOSIS — E7800 Pure hypercholesterolemia, unspecified: Secondary | ICD-10-CM | POA: Diagnosis not present

## 2013-03-09 DIAGNOSIS — I10 Essential (primary) hypertension: Secondary | ICD-10-CM

## 2013-03-09 DIAGNOSIS — E038 Other specified hypothyroidism: Secondary | ICD-10-CM | POA: Diagnosis not present

## 2013-03-09 DIAGNOSIS — F3289 Other specified depressive episodes: Secondary | ICD-10-CM

## 2013-03-09 DIAGNOSIS — E039 Hypothyroidism, unspecified: Secondary | ICD-10-CM

## 2013-03-09 DIAGNOSIS — F329 Major depressive disorder, single episode, unspecified: Secondary | ICD-10-CM

## 2013-03-09 DIAGNOSIS — F32A Depression, unspecified: Secondary | ICD-10-CM

## 2013-03-09 NOTE — Progress Notes (Signed)
Pre-visit discussion using our clinic review tool. No additional management support is needed unless otherwise documented below in the visit note.  

## 2013-03-12 ENCOUNTER — Encounter: Payer: Self-pay | Admitting: Internal Medicine

## 2013-03-12 NOTE — Assessment & Plan Note (Signed)
Increased stress with the recent death of her daughter.  Feels she is coping relatively well.  Followed by Dr Lenis NoonLevine.

## 2013-03-12 NOTE — Assessment & Plan Note (Signed)
CT as outlined.  Declines any further w/up.

## 2013-03-12 NOTE — Assessment & Plan Note (Signed)
Follow tsh, free T4 and free T3.

## 2013-03-12 NOTE — Assessment & Plan Note (Signed)
Blood pressure as outlined.  Doing well.  Follow.  Check metabolic panel.

## 2013-03-12 NOTE — Assessment & Plan Note (Signed)
Low cholesterol diet.  Follow lipid panel.    

## 2013-03-12 NOTE — Progress Notes (Signed)
  Subjective:    Patient ID: Sarah [name], female    DOB: 05/05/1923, 80 y.o.   MRN: [id number]  HPI 80 year old female with past history of hypertension and hypercholesterolemia who comes in today to follow up on these issues as well as for a complete physical exam.   She was recently admitted with a right non displaced femoral neck fracture.  Is s/p pinning 05/06/12.  She required rehab post hospitalization.  Had home physical therapy.  Doing well. Able to ambulate around her house.  Feels good.  Eating and drinking well.  Bowels stable.  While in the hospital, she had a cxr and subsequent CT chest.  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  Indeterminate 3mm pulmonary nodules.  Have discussed this with her.   She declines further w/up.  Reports swallowing ok.  No sob or cough.     Past Medical History  Diagnosis Date  . Arthritis   . Depression   . Glaucoma   . Hypercholesterolemia   . Subclinical hypothyroidism   . Hypertension     Current Outpatient Prescriptions on File Prior to Visit  Medication Sig Dispense Refill  . amLODipine (NORVASC) 5 MG tablet Take 1 tablet (5 mg total) by mouth daily.  30 tablet  5  . fluticasone (FLONASE) 50 MCG/ACT nasal spray Place 2 sprays into both nostrils daily.  16 g  2  . Multiple Vitamins-Minerals (CENTRUM SILVER PO) Take 1 tablet by mouth daily.      Marland Kitchen. venlafaxine XR (EFFEXOR-XR) 150 MG 24 hr capsule Take three tablets by mouth daily       No current facility-administered medications on file prior to visit.    Review of Systems Patient denies any headache, lightheadedness or dizziness.  No chest pain, tightness or palpitations. No increased shortness of breath, cough or congestion.  No acid reflux, dysphagia or  odynophagia.  No abdominal pain.  Bowels stable. Doing well s/p surgery.  Moving around well.        Objective:   Physical Exam  Filed Vitals:   03/09/13 1039  BP: 138/80  Pulse: 80  Temp: 98.2 F (36.8 C)   Blood pressure recheck:  59138/10872  80 year old female in no acute distress.   HEENT:  Nares- clear.  Oropharynx - without lesions. NECK:  Supple.  Nontender.  No audible bruit.  HEART:  Appears to be regular. LUNGS:  No crackles or wheezing audible.  Respirations even and unlabored.  RADIAL PULSE:  Equal bilaterally.    BREASTS: she declined.   ABDOMEN:  Soft, nontender.  Bowel sounds present and normal.  No audible abdominal bruit.  GU:  Not performed.     EXTREMITIES:  No increased edema present.  DP pulses palpable and equal bilaterally.          Assessment & Plan:  MSK.  See above. Doing well s/p surgery for femoral neck fracture.  Continues to follow up with ortho.  HEALTH MAINTENANCE.  Physical today.  She declines mammogram, colonoscopy, bone density and breast/pelvic exam.

## 2013-03-12 NOTE — Assessment & Plan Note (Signed)
Low carb diet.  Follow a1c.   

## 2013-04-02 ENCOUNTER — Other Ambulatory Visit (INDEPENDENT_AMBULATORY_CARE_PROVIDER_SITE_OTHER): Payer: Medicare Other

## 2013-04-02 DIAGNOSIS — R7309 Other abnormal glucose: Secondary | ICD-10-CM | POA: Diagnosis not present

## 2013-04-02 DIAGNOSIS — I10 Essential (primary) hypertension: Secondary | ICD-10-CM

## 2013-04-02 DIAGNOSIS — E7800 Pure hypercholesterolemia, unspecified: Secondary | ICD-10-CM

## 2013-04-02 DIAGNOSIS — E038 Other specified hypothyroidism: Secondary | ICD-10-CM | POA: Diagnosis not present

## 2013-04-02 DIAGNOSIS — E039 Hypothyroidism, unspecified: Secondary | ICD-10-CM

## 2013-04-02 DIAGNOSIS — R739 Hyperglycemia, unspecified: Secondary | ICD-10-CM

## 2013-04-02 LAB — T3, FREE: T3 FREE: 3.1 pg/mL (ref 2.3–4.2)

## 2013-04-02 LAB — LIPID PANEL
CHOL/HDL RATIO: 4
Cholesterol: 253 mg/dL — ABNORMAL HIGH (ref 0–200)
HDL: 66.3 mg/dL (ref >39.00)
LDL CALC: 153 mg/dL — AB (ref 0–99)
Triglycerides: 167 mg/dL — ABNORMAL HIGH (ref 0.0–149.0)
VLDL: 33.4 mg/dL (ref 0.0–40.0)

## 2013-04-02 LAB — COMPREHENSIVE METABOLIC PANEL
ALK PHOS: 115 U/L (ref 39–117)
ALT: 23 U/L (ref 0–35)
AST: 29 U/L (ref 0–37)
Albumin: 4.2 g/dL (ref 3.5–5.2)
BILIRUBIN TOTAL: 0.8 mg/dL (ref 0.3–1.2)
BUN: 12 mg/dL (ref 6–23)
CO2: 27 mEq/L (ref 19–32)
CREATININE: 0.8 mg/dL (ref 0.4–1.2)
Calcium: 9.6 mg/dL (ref 8.4–10.5)
Chloride: 107 mEq/L (ref 96–112)
GFR: 75.29 mL/min (ref >60.00)
Glucose, Bld: 109 mg/dL — ABNORMAL HIGH (ref 70–99)
Potassium: 4.3 mEq/L (ref 3.5–5.1)
Sodium: 142 mEq/L (ref 135–145)
Total Protein: 7.2 g/dL (ref 6.0–8.3)

## 2013-04-02 LAB — HEMOGLOBIN A1C: Hgb A1c MFr Bld: 6 % (ref 4.6–6.5)

## 2013-04-02 LAB — T4, FREE: Free T4: 0.92 ng/dL (ref 0.60–1.60)

## 2013-04-02 LAB — TSH: TSH: 0.14 u[IU]/mL — AB (ref 0.35–5.50)

## 2013-04-09 ENCOUNTER — Encounter: Payer: Self-pay | Admitting: *Deleted

## 2013-05-09 ENCOUNTER — Encounter: Payer: Self-pay | Admitting: Internal Medicine

## 2013-05-23 DIAGNOSIS — F332 Major depressive disorder, recurrent severe without psychotic features: Secondary | ICD-10-CM | POA: Diagnosis not present

## 2013-05-29 DIAGNOSIS — H4010X0 Unspecified open-angle glaucoma, stage unspecified: Secondary | ICD-10-CM | POA: Diagnosis not present

## 2013-07-11 ENCOUNTER — Ambulatory Visit: Payer: Medicare Other | Admitting: Internal Medicine

## 2013-08-14 ENCOUNTER — Encounter: Payer: Self-pay | Admitting: Internal Medicine

## 2013-08-14 ENCOUNTER — Ambulatory Visit (INDEPENDENT_AMBULATORY_CARE_PROVIDER_SITE_OTHER): Payer: Medicare Other | Admitting: Internal Medicine

## 2013-08-14 VITALS — BP 142/74 | HR 73 | Temp 98.8°F | Ht 61.0 in | Wt 137.0 lb

## 2013-08-14 DIAGNOSIS — F3289 Other specified depressive episodes: Secondary | ICD-10-CM | POA: Diagnosis not present

## 2013-08-14 DIAGNOSIS — R7309 Other abnormal glucose: Secondary | ICD-10-CM

## 2013-08-14 DIAGNOSIS — E038 Other specified hypothyroidism: Secondary | ICD-10-CM

## 2013-08-14 DIAGNOSIS — E039 Hypothyroidism, unspecified: Secondary | ICD-10-CM

## 2013-08-14 DIAGNOSIS — E7800 Pure hypercholesterolemia, unspecified: Secondary | ICD-10-CM | POA: Diagnosis not present

## 2013-08-14 DIAGNOSIS — F329 Major depressive disorder, single episode, unspecified: Secondary | ICD-10-CM

## 2013-08-14 DIAGNOSIS — R9389 Abnormal findings on diagnostic imaging of other specified body structures: Secondary | ICD-10-CM

## 2013-08-14 DIAGNOSIS — I10 Essential (primary) hypertension: Secondary | ICD-10-CM

## 2013-08-14 DIAGNOSIS — F32A Depression, unspecified: Secondary | ICD-10-CM

## 2013-08-14 DIAGNOSIS — R739 Hyperglycemia, unspecified: Secondary | ICD-10-CM

## 2013-08-14 NOTE — Progress Notes (Signed)
Pre visit review using our clinic review tool, if applicable. No additional management support is needed unless otherwise documented below in the visit note. 

## 2013-08-15 ENCOUNTER — Encounter: Payer: Self-pay | Admitting: Internal Medicine

## 2013-08-15 NOTE — Assessment & Plan Note (Signed)
Increased stress with the recent death of her daughter.  Feels she is coping relatively well.  Followed by Dr Lenis NoonLevine.

## 2013-08-15 NOTE — Assessment & Plan Note (Signed)
Blood pressure as outlined.  Doing well.  Follow.  Check metabolic panel.

## 2013-08-15 NOTE — Assessment & Plan Note (Signed)
Low cholesterol diet.  Follow lipid panel.    

## 2013-08-15 NOTE — Assessment & Plan Note (Signed)
Follow tsh, free T4 and free T3.   Desires no further w/up or evaluation for the thyroid nodule.

## 2013-08-15 NOTE — Assessment & Plan Note (Signed)
Low carb diet.  Follow a1c.   

## 2013-08-15 NOTE — Progress Notes (Signed)
  Subjective:    Patient ID: Sarah [name], female    DOB: 08/31/1923, 80 y.o.   MRN: [id number]  HPI 80 year old female with past history of hypertension and hypercholesterolemia who comes in today to follow up for a scheduled follow up.  She was previously admitted with a right non displaced femoral neck fracture.  Is s/p pinning 05/06/12.  She required rehab post hospitalization.  Had home physical therapy.  Doing well. Able to ambulate around her house.  Feels good.  Eating and drinking well.  Bowels stable.  While in the hospital, she had a cxr and subsequent CT chest.  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  Indeterminate 3mm pulmonary nodules.  Have discussed this with her.   Again discussed today.  She declines further w/up.  Reports swallowing ok.  No sob or cough.  Overall feels good.  Handling stress relatively well.  Still seeing Dr Mare FerrariLavine.      Past Medical History  Diagnosis Date  . Arthritis   . Depression   . Glaucoma   . Hypercholesterolemia   . Subclinical hypothyroidism   . Hypertension     Current Outpatient Prescriptions on File Prior to Visit  Medication Sig Dispense Refill  . amLODipine (NORVASC) 5 MG tablet Take 1 tablet (5 mg total) by mouth daily.  30 tablet  5  . fluticasone (FLONASE) 50 MCG/ACT nasal spray Place 2 sprays into both nostrils daily.  16 g  2  . Multiple Vitamins-Minerals (CENTRUM SILVER PO) Take 1 tablet by mouth daily.      Marland Kitchen. venlafaxine XR (EFFEXOR-XR) 150 MG 24 hr capsule Take three tablets by mouth daily       No current facility-administered medications on file prior to visit.    Review of Systems Patient denies any headache, lightheadedness or dizziness.  No sinus or allergy symptoms.   No chest pain,  tightness or palpitations. No increased shortness of breath, cough or congestion.  No acid reflux, dysphagia or odynophagia.  No abdominal pain.  Bowels stable. Doing well s/p surgery.  Moving around well.   Handling stress well.       Objective:   Physical Exam  Filed Vitals:   08/14/13 1116  BP: 142/74  Pulse: 73  Temp: 98.8 F (37.1 C)   Blood pressure recheck:  17124/6368  80 year old female in no acute distress.   HEENT:  Nares- clear.  Oropharynx - without lesions. NECK:  Supple.  Nontender.  No audible bruit.  HEART:  Appears to be regular. LUNGS:  No crackles or wheezing audible.  Respirations even and unlabored.  RADIAL PULSE:  Equal bilaterally.   ABDOMEN:  Soft, nontender.  Bowel sounds present and normal.  No audible abdominal bruit.   EXTREMITIES:  No increased edema present.  DP pulses palpable and equal bilaterally.          Assessment & Plan:  MSK.  See above. Doing well s/p surgery for femoral neck fracture.    HEALTH MAINTENANCE.  Physical 03/09/13.  She declines mammogram, colonoscopy, bone density and breast/pelvic exam.

## 2013-08-15 NOTE — Assessment & Plan Note (Signed)
CT as outlined.  Declines any further w/up.

## 2013-08-20 DIAGNOSIS — F332 Major depressive disorder, recurrent severe without psychotic features: Secondary | ICD-10-CM | POA: Diagnosis not present

## 2013-08-23 ENCOUNTER — Other Ambulatory Visit (INDEPENDENT_AMBULATORY_CARE_PROVIDER_SITE_OTHER): Payer: Medicare Other

## 2013-08-23 DIAGNOSIS — I10 Essential (primary) hypertension: Secondary | ICD-10-CM | POA: Diagnosis not present

## 2013-08-23 DIAGNOSIS — E038 Other specified hypothyroidism: Secondary | ICD-10-CM

## 2013-08-23 DIAGNOSIS — E7800 Pure hypercholesterolemia, unspecified: Secondary | ICD-10-CM

## 2013-08-23 DIAGNOSIS — E039 Hypothyroidism, unspecified: Secondary | ICD-10-CM

## 2013-08-23 DIAGNOSIS — R7309 Other abnormal glucose: Secondary | ICD-10-CM

## 2013-08-23 DIAGNOSIS — R739 Hyperglycemia, unspecified: Secondary | ICD-10-CM

## 2013-08-23 LAB — COMPREHENSIVE METABOLIC PANEL
ALT: 20 U/L (ref 0–35)
AST: 25 U/L (ref 0–37)
Albumin: 4.2 g/dL (ref 3.5–5.2)
Alkaline Phosphatase: 112 U/L (ref 39–117)
BUN: 9 mg/dL (ref 6–23)
CALCIUM: 9.8 mg/dL (ref 8.4–10.5)
CHLORIDE: 102 meq/L (ref 96–112)
CO2: 30 mEq/L (ref 19–32)
Creatinine, Ser: 0.7 mg/dL (ref 0.4–1.2)
GFR: 80 mL/min (ref >60.00)
GLUCOSE: 115 mg/dL — AB (ref 70–99)
POTASSIUM: 5.2 meq/L — AB (ref 3.5–5.1)
SODIUM: 139 meq/L (ref 135–145)
TOTAL PROTEIN: 7.2 g/dL (ref 6.0–8.3)
Total Bilirubin: 0.7 mg/dL (ref 0.2–1.2)

## 2013-08-23 LAB — LIPID PANEL
CHOLESTEROL: 241 mg/dL — AB (ref 0–200)
HDL: 52.3 mg/dL (ref >39.00)
NonHDL: 188.7
Total CHOL/HDL Ratio: 5
Triglycerides: 283 mg/dL — ABNORMAL HIGH (ref 0.0–149.0)
VLDL: 56.6 mg/dL — ABNORMAL HIGH (ref 0.0–40.0)

## 2013-08-23 LAB — LDL CHOLESTEROL, DIRECT: Direct LDL: 141.7 mg/dL

## 2013-08-23 LAB — T4, FREE: FREE T4: 1.02 ng/dL (ref 0.60–1.60)

## 2013-08-23 LAB — HEMOGLOBIN A1C: HEMOGLOBIN A1C: 6 % (ref 4.6–6.5)

## 2013-08-23 LAB — TSH: TSH: 0.25 u[IU]/mL — AB (ref 0.35–4.50)

## 2013-08-23 LAB — T3, FREE: T3 FREE: 2.9 pg/mL (ref 2.3–4.2)

## 2013-08-24 ENCOUNTER — Other Ambulatory Visit: Payer: Self-pay | Admitting: Internal Medicine

## 2013-08-24 DIAGNOSIS — E875 Hyperkalemia: Secondary | ICD-10-CM

## 2013-08-24 NOTE — Progress Notes (Signed)
Order placed for f/u potassium.  

## 2013-08-27 ENCOUNTER — Other Ambulatory Visit: Payer: Self-pay | Admitting: *Deleted

## 2013-08-27 MED ORDER — AMLODIPINE BESYLATE 5 MG PO TABS: 5.0000 mg | ORAL_TABLET | Freq: Every day | ORAL | Status: DC

## 2013-08-28 ENCOUNTER — Other Ambulatory Visit: Payer: Medicare Other

## 2013-08-29 ENCOUNTER — Other Ambulatory Visit (INDEPENDENT_AMBULATORY_CARE_PROVIDER_SITE_OTHER): Payer: Medicare Other

## 2013-08-29 DIAGNOSIS — E875 Hyperkalemia: Secondary | ICD-10-CM

## 2013-08-29 LAB — POTASSIUM: POTASSIUM: 3.5 meq/L (ref 3.5–5.1)

## 2013-10-15 ENCOUNTER — Emergency Department: Payer: Self-pay | Admitting: Emergency Medicine

## 2013-10-15 DIAGNOSIS — F3289 Other specified depressive episodes: Secondary | ICD-10-CM | POA: Diagnosis not present

## 2013-10-15 DIAGNOSIS — S52509A Unspecified fracture of the lower end of unspecified radius, initial encounter for closed fracture: Secondary | ICD-10-CM | POA: Diagnosis not present

## 2013-10-15 DIAGNOSIS — I10 Essential (primary) hypertension: Secondary | ICD-10-CM | POA: Diagnosis not present

## 2013-10-15 DIAGNOSIS — F329 Major depressive disorder, single episode, unspecified: Secondary | ICD-10-CM | POA: Diagnosis not present

## 2013-10-15 DIAGNOSIS — S59909A Unspecified injury of unspecified elbow, initial encounter: Secondary | ICD-10-CM | POA: Diagnosis not present

## 2013-10-16 DIAGNOSIS — S52539A Colles' fracture of unspecified radius, initial encounter for closed fracture: Secondary | ICD-10-CM | POA: Diagnosis not present

## 2013-10-30 DIAGNOSIS — S52532D Colles' fracture of left radius, subsequent encounter for closed fracture with routine healing: Secondary | ICD-10-CM | POA: Diagnosis not present

## 2013-11-08 DIAGNOSIS — S52532D Colles' fracture of left radius, subsequent encounter for closed fracture with routine healing: Secondary | ICD-10-CM | POA: Diagnosis not present

## 2013-11-27 DIAGNOSIS — H4010X2 Unspecified open-angle glaucoma, moderate stage: Secondary | ICD-10-CM | POA: Diagnosis not present

## 2013-12-06 DIAGNOSIS — S52532D Colles' fracture of left radius, subsequent encounter for closed fracture with routine healing: Secondary | ICD-10-CM | POA: Diagnosis not present

## 2013-12-14 IMAGING — CR RIGHT HIP - 1 VIEW
1 series · 1 of 1 positions shown · non-contrast
Comparison: none

REASON FOR EXAM: post op
COMMENTS:   Bedside (portable):Y

PROCEDURE:     DXR - DXR HIP RIGHT ONE VIEW  - May 06, 2012  [DATE]
RESULT:     Comparison: 05/05/2012

[lat]
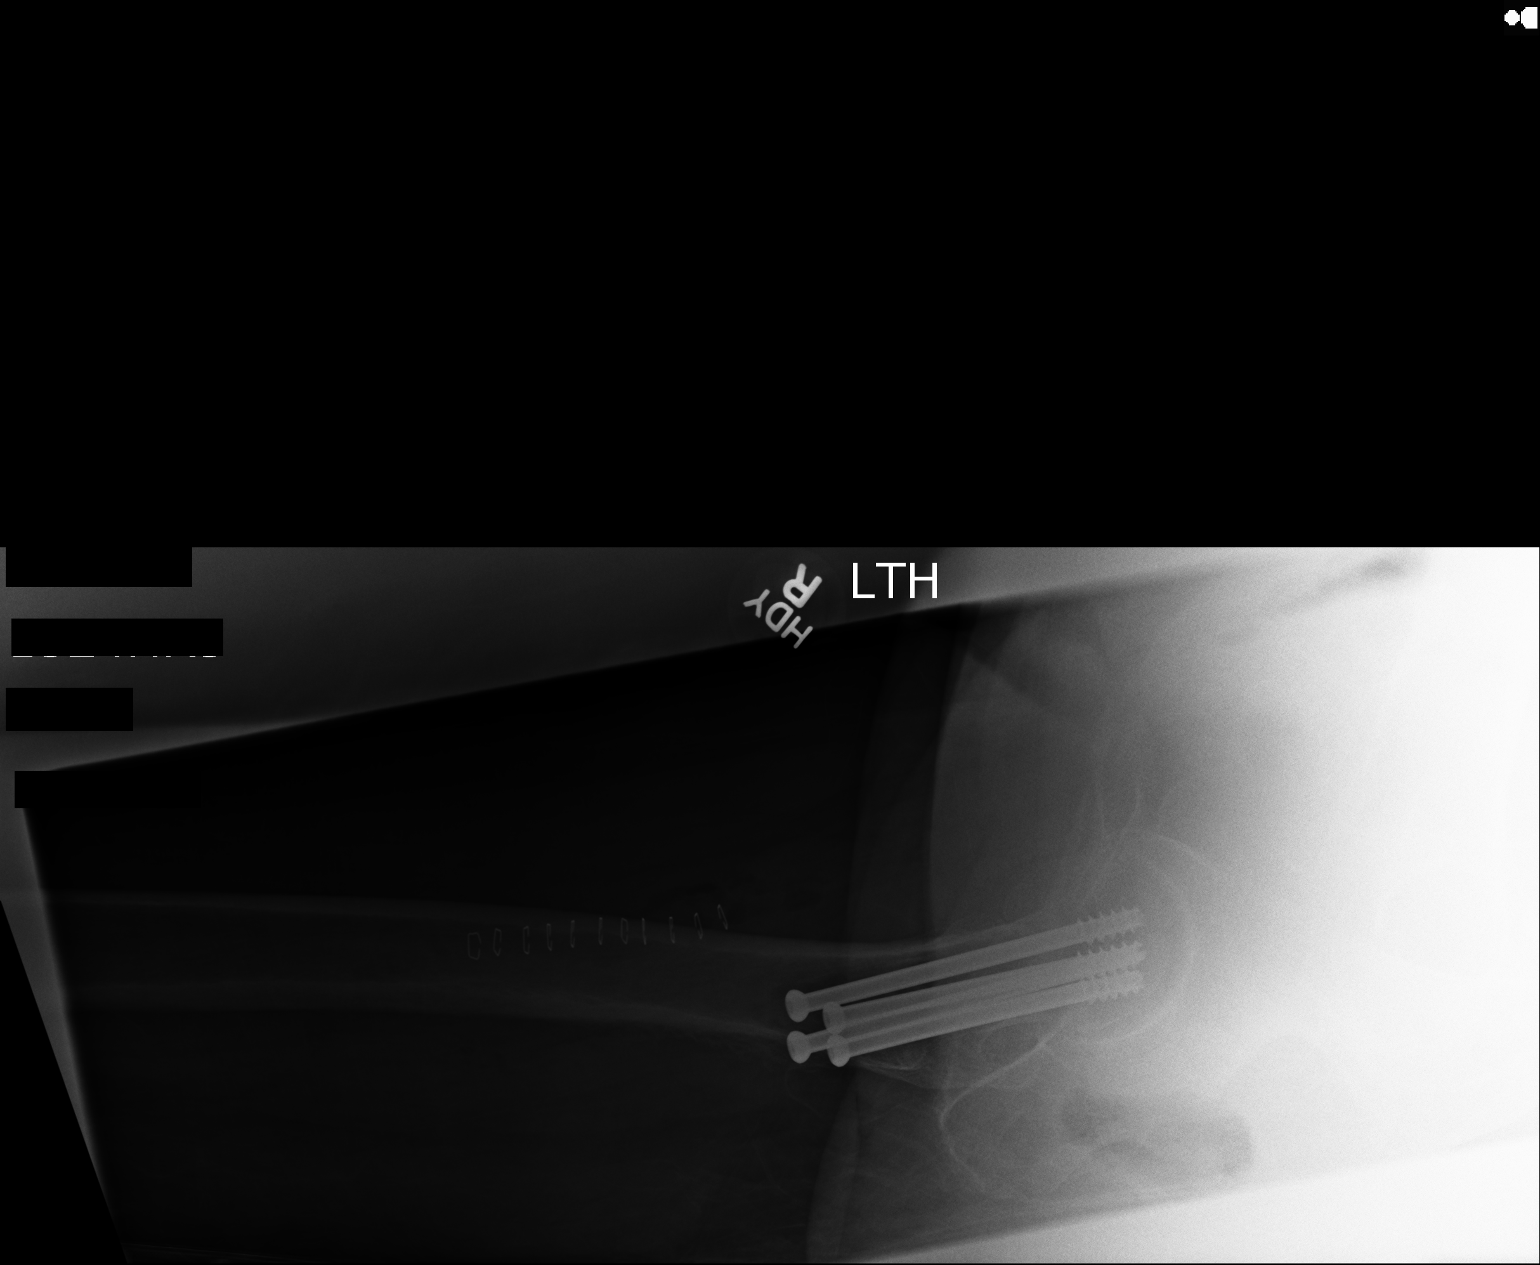

[1 of 1 positions shown; findings below may reference images not displayed]

FINDINGS: Four cannulated screws seen traversing the femoral head and into the femoral
neck. Skin staples are present.
IMPRESSION: ORIF of subcapital femoral neck fracture.

[REDACTED]

## 2013-12-14 IMAGING — XA DG C-ARM 1-60 MIN
1 series · 2 of 2 positions shown · non-contrast
Comparison: none

REASON FOR EXAM: Fracture reduction/fixation/right hip percutaneous
fixation
COMMENTS:

PROCEDURE:     DXR - DXR C-ARM WITH 2 VIEWS RT HIP  - May 06, 2012 [DATE]
RESULT:     Comparison: 05/05/2012

[Series 6001: (id) new (id) · 2 of 2 slices shown]
[im 1/2]
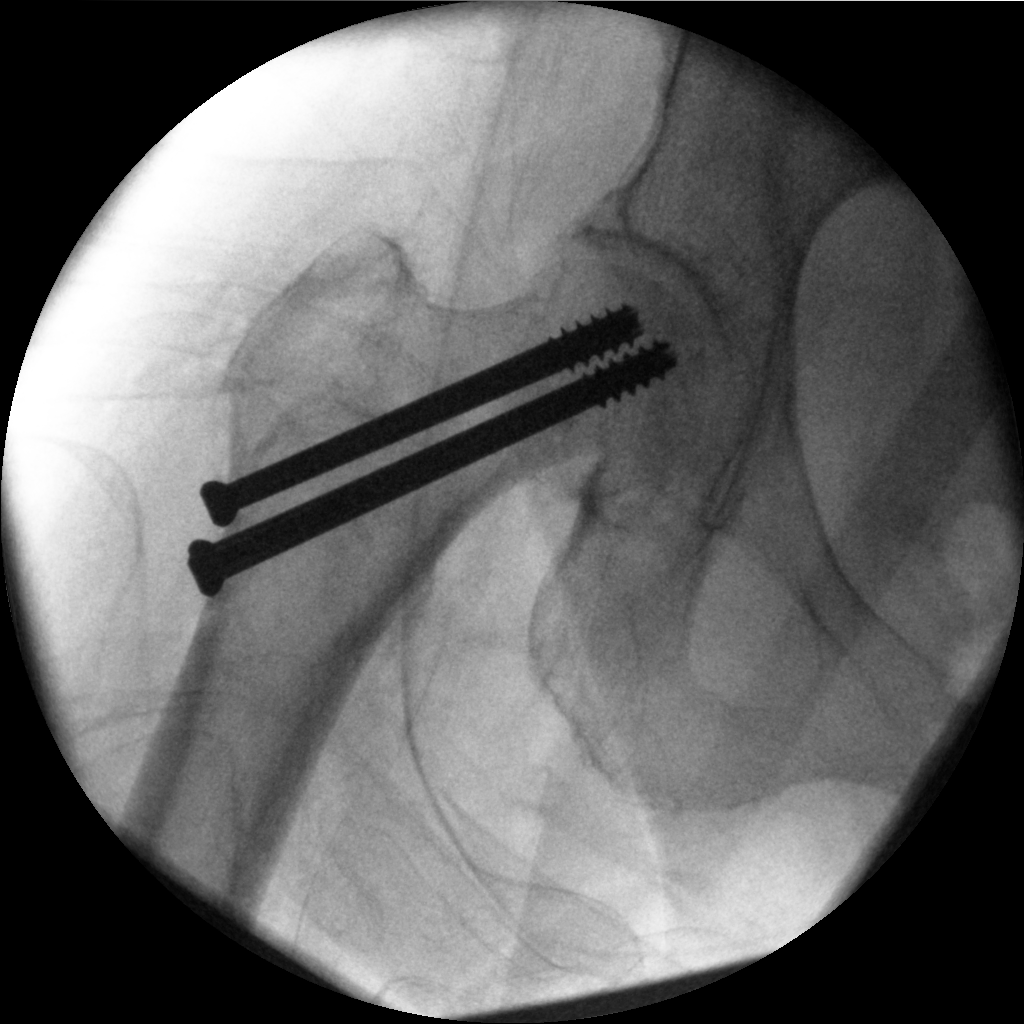
[im 2/2]
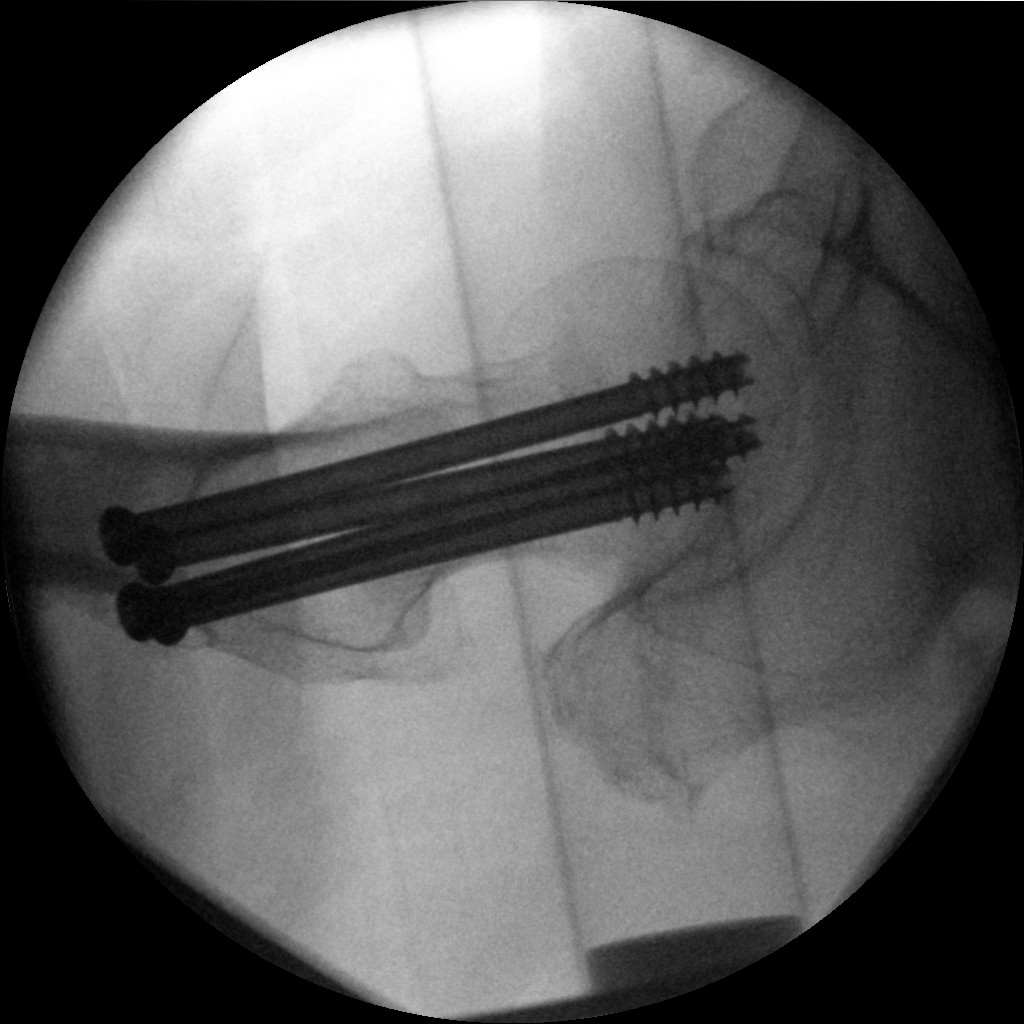

[2 of 2 positions shown; findings below may reference images not displayed]

FINDINGS: Two images were submitted from intraoperative procedure. Multiple cannulated
screws are seen traversing the femoral neck, into the femoral head. The
remainder of the interpretation is deferred to the performing clinician.
IMPRESSION: Please see above.

[REDACTED]

## 2013-12-16 IMAGING — CR RIGHT HIP - 1 VIEW
1 series · 1 of 1 positions shown · non-contrast
Comparison: none

REASON FOR EXAM: evaluate for fracture
COMMENTS:

PROCEDURE:     DXR - DXR HIP RIGHT ONE VIEW  - May 08, 2012  [DATE]
RESULT:     Crosstable lateral view of the right hip shows cannulated screws
present from previous ORIF of the subcapital right femoral fracture without
definite complication.

[lat]
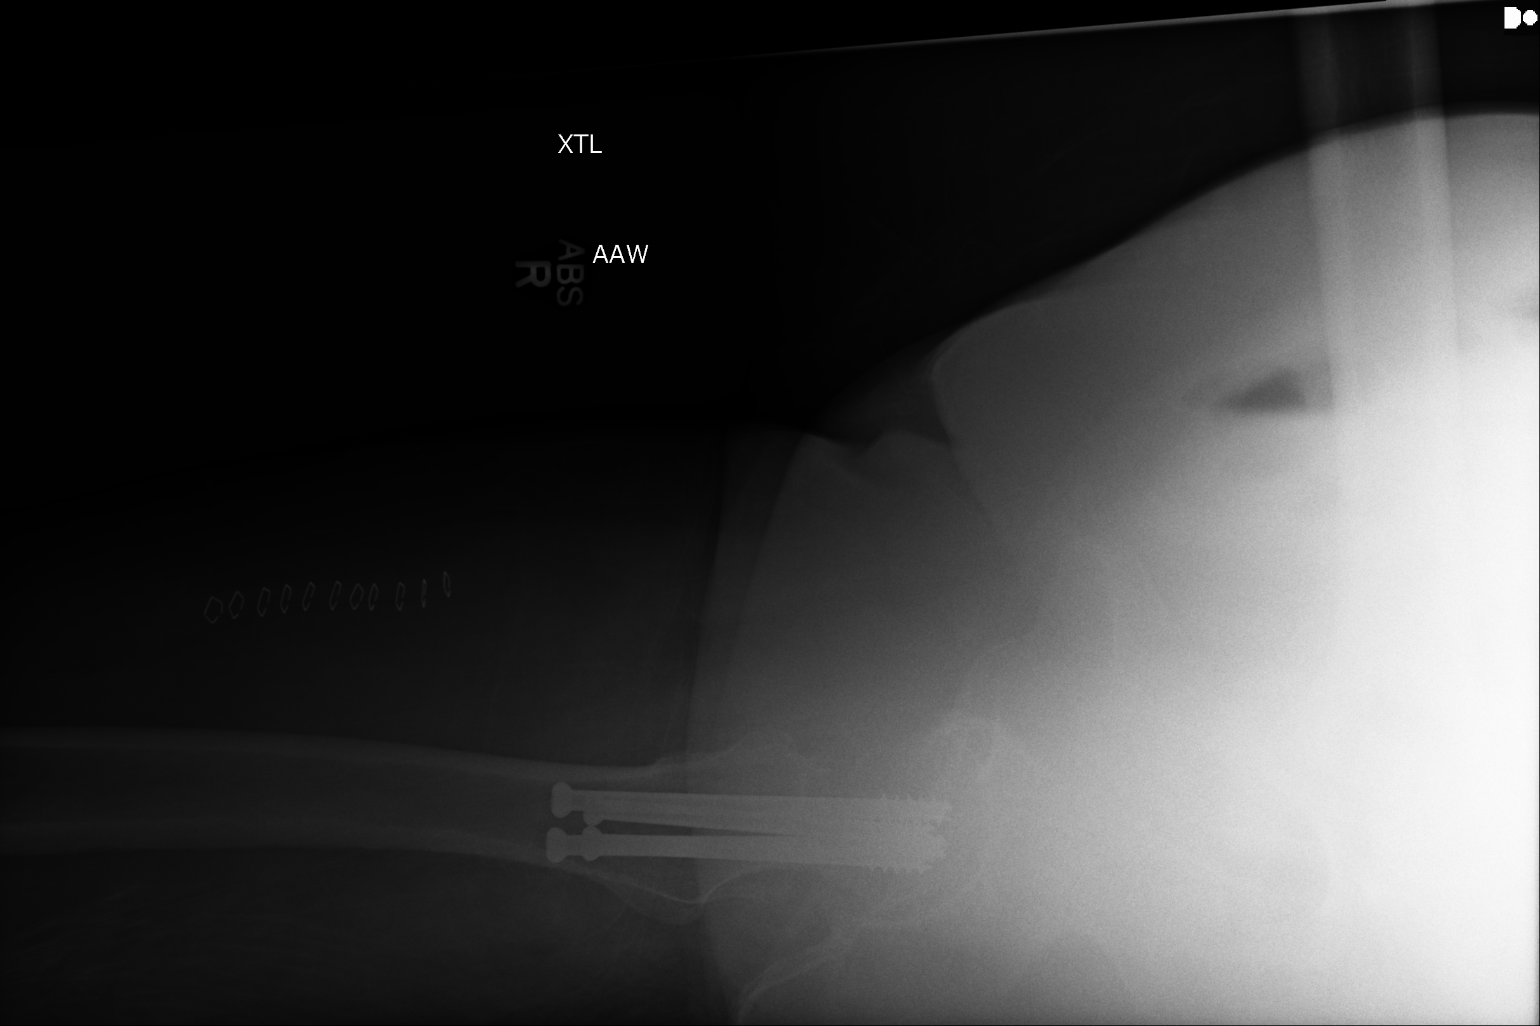

[1 of 1 positions shown; findings below may reference images not displayed]

IMPRESSION: Stable appearance.

[REDACTED]

## 2013-12-16 IMAGING — CR PELVIS - 1-2 VIEW
1 series · 1 of 1 positions shown · non-contrast
Comparison: none

REASON FOR EXAM: Evaluate for fracture
COMMENTS:

[ap]
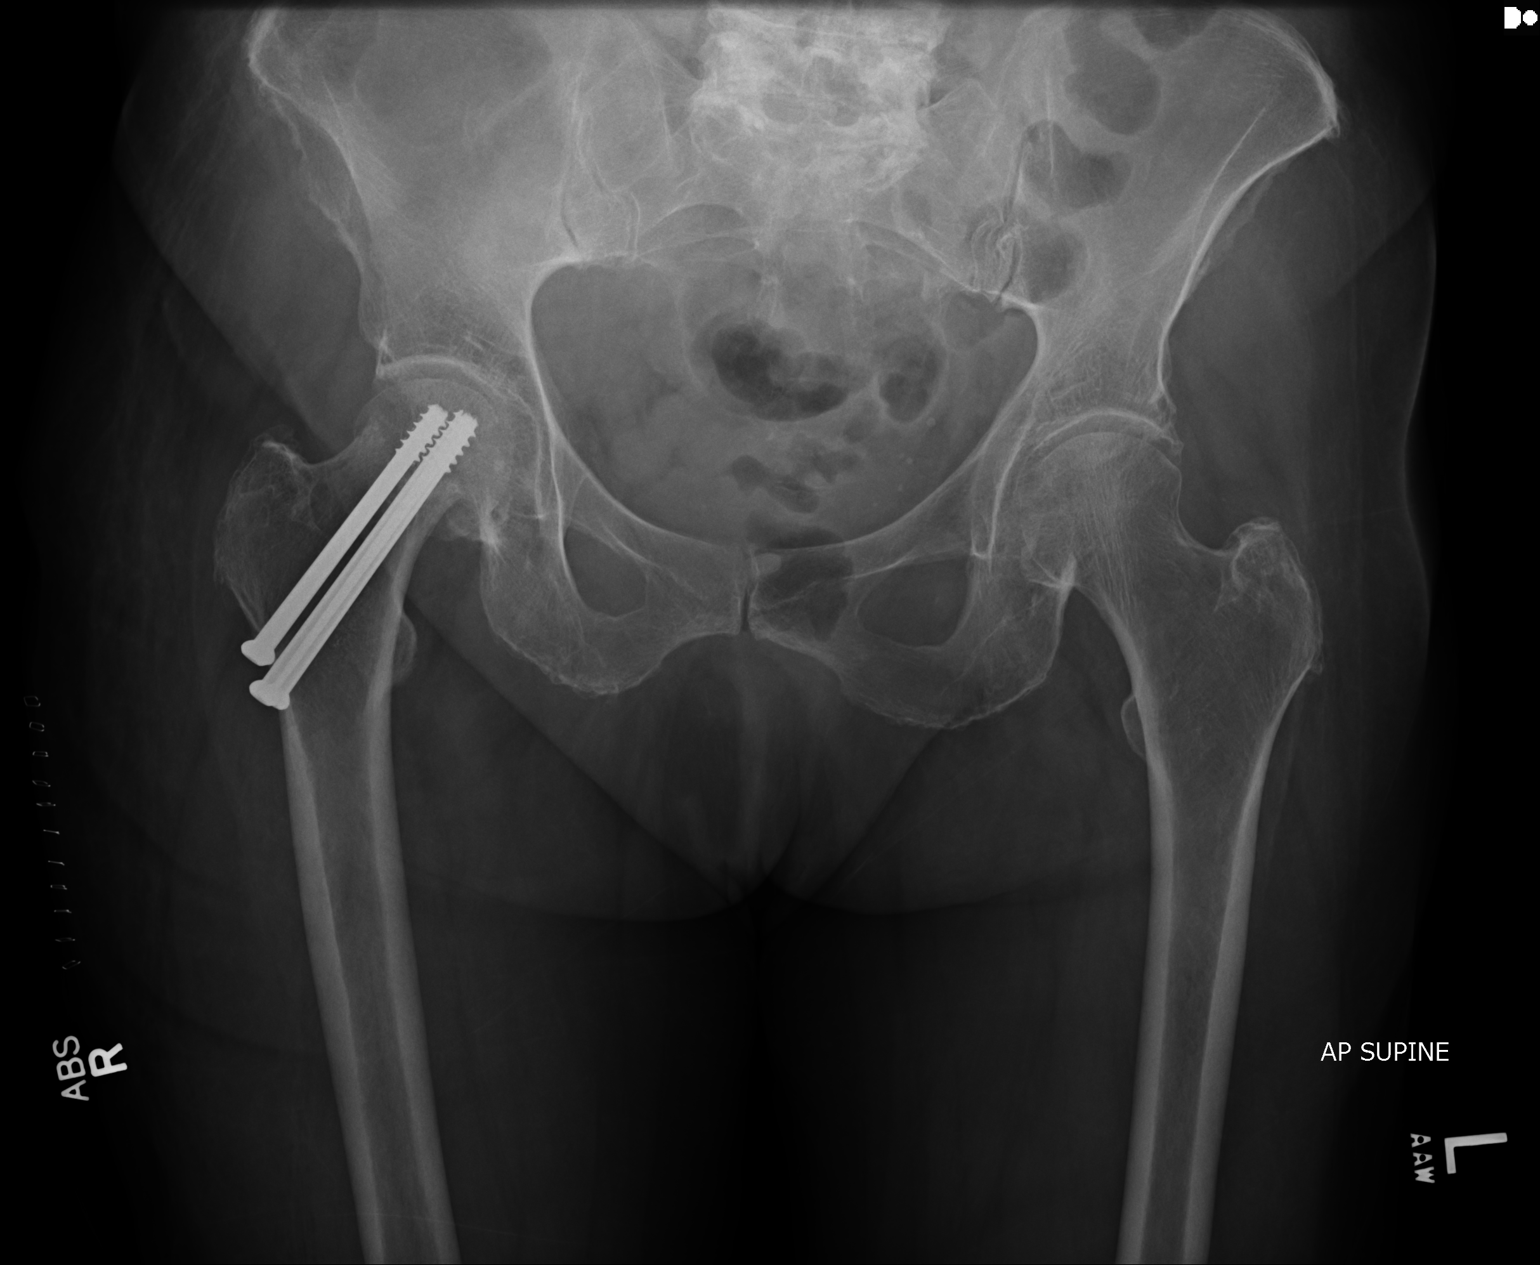

[1 of 1 positions shown; findings below may reference images not displayed]

PROCEDURE:     DXR - DXR PELVIS AP ONLY  - May 08, 2012  [DATE]

RESULT:     Comparison is made to the study dated 06 May, 2012. Multiple
cannulated screws are seen in the proximal femur from a lateral approach
through the neck into the head. There is anatomic alignment of the
subcapital fracture. No new abnormality is seen.
IMPRESSION: Stable appearance.

[REDACTED]

## 2013-12-18 ENCOUNTER — Other Ambulatory Visit: Payer: Self-pay | Admitting: Internal Medicine

## 2013-12-18 ENCOUNTER — Ambulatory Visit (INDEPENDENT_AMBULATORY_CARE_PROVIDER_SITE_OTHER): Payer: Medicare Other | Admitting: Internal Medicine

## 2013-12-18 ENCOUNTER — Other Ambulatory Visit (INDEPENDENT_AMBULATORY_CARE_PROVIDER_SITE_OTHER): Payer: Medicare Other

## 2013-12-18 ENCOUNTER — Encounter: Payer: Self-pay | Admitting: Internal Medicine

## 2013-12-18 VITALS — BP 130/70 | HR 89 | Temp 98.4°F | Ht 61.0 in | Wt 133.2 lb

## 2013-12-18 DIAGNOSIS — R739 Hyperglycemia, unspecified: Secondary | ICD-10-CM | POA: Diagnosis not present

## 2013-12-18 DIAGNOSIS — E039 Hypothyroidism, unspecified: Secondary | ICD-10-CM

## 2013-12-18 DIAGNOSIS — F329 Major depressive disorder, single episode, unspecified: Secondary | ICD-10-CM | POA: Diagnosis not present

## 2013-12-18 DIAGNOSIS — R938 Abnormal findings on diagnostic imaging of other specified body structures: Secondary | ICD-10-CM

## 2013-12-18 DIAGNOSIS — R9389 Abnormal findings on diagnostic imaging of other specified body structures: Secondary | ICD-10-CM

## 2013-12-18 DIAGNOSIS — E7800 Pure hypercholesterolemia, unspecified: Secondary | ICD-10-CM

## 2013-12-18 DIAGNOSIS — I10 Essential (primary) hypertension: Secondary | ICD-10-CM

## 2013-12-18 DIAGNOSIS — R946 Abnormal results of thyroid function studies: Secondary | ICD-10-CM

## 2013-12-18 DIAGNOSIS — E038 Other specified hypothyroidism: Secondary | ICD-10-CM | POA: Diagnosis not present

## 2013-12-18 DIAGNOSIS — R7989 Other specified abnormal findings of blood chemistry: Secondary | ICD-10-CM

## 2013-12-18 DIAGNOSIS — F32A Depression, unspecified: Secondary | ICD-10-CM

## 2013-12-18 LAB — T4, FREE: FREE T4: 1.06 ng/dL (ref 0.60–1.60)

## 2013-12-18 LAB — CBC WITH DIFFERENTIAL/PLATELET
Basophils Absolute: 0 10*3/uL (ref 0.0–0.1)
Basophils Relative: 0.7 % (ref 0.0–3.0)
EOS PCT: 4.2 % (ref 0.0–5.0)
Eosinophils Absolute: 0.3 10*3/uL (ref 0.0–0.7)
HCT: 40.2 % (ref 36.0–46.0)
Hemoglobin: 13.1 g/dL (ref 12.0–15.0)
Lymphocytes Relative: 32 % (ref 12.0–46.0)
Lymphs Abs: 2 10*3/uL (ref 0.7–4.0)
MCHC: 32.7 g/dL (ref 30.0–36.0)
MCV: 88.9 fl (ref 78.0–100.0)
MONOS PCT: 7.7 % (ref 3.0–12.0)
Monocytes Absolute: 0.5 10*3/uL (ref 0.1–1.0)
Neutro Abs: 3.5 10*3/uL (ref 1.4–7.7)
Neutrophils Relative %: 55.4 % (ref 43.0–77.0)
Platelets: 290 10*3/uL (ref 150.0–400.0)
RBC: 4.52 Mil/uL (ref 3.87–5.11)
RDW: 12.8 % (ref 11.5–15.5)
WBC: 6.4 10*3/uL (ref 4.0–10.5)

## 2013-12-18 LAB — LIPID PANEL
CHOL/HDL RATIO: 4
Cholesterol: 218 mg/dL — ABNORMAL HIGH (ref 0–200)
HDL: 54 mg/dL (ref >39.00)
LDL Cholesterol: 132 mg/dL — ABNORMAL HIGH (ref 0–99)
NonHDL: 164
Triglycerides: 158 mg/dL — ABNORMAL HIGH (ref 0.0–149.0)
VLDL: 31.6 mg/dL (ref 0.0–40.0)

## 2013-12-18 LAB — COMPREHENSIVE METABOLIC PANEL
ALT: 18 U/L (ref 0–35)
AST: 22 U/L (ref 0–37)
Albumin: 4.2 g/dL (ref 3.5–5.2)
Alkaline Phosphatase: 118 U/L — ABNORMAL HIGH (ref 39–117)
BILIRUBIN TOTAL: 0.7 mg/dL (ref 0.2–1.2)
BUN: 14 mg/dL (ref 6–23)
CO2: 25 meq/L (ref 19–32)
Calcium: 9.4 mg/dL (ref 8.4–10.5)
Chloride: 102 mEq/L (ref 96–112)
Creatinine, Ser: 0.7 mg/dL (ref 0.4–1.2)
GFR: 86.76 mL/min (ref >60.00)
Glucose, Bld: 83 mg/dL (ref 70–99)
Potassium: 4.3 mEq/L (ref 3.5–5.1)
Sodium: 139 mEq/L (ref 135–145)
Total Protein: 7.1 g/dL (ref 6.0–8.3)

## 2013-12-18 LAB — TSH: TSH: 0.1 u[IU]/mL — AB (ref 0.35–4.50)

## 2013-12-18 LAB — T3, FREE: T3, Free: 3.7 pg/mL (ref 2.3–4.2)

## 2013-12-18 LAB — HEMOGLOBIN A1C: Hgb A1c MFr Bld: 6 % (ref 4.6–6.5)

## 2013-12-18 NOTE — Progress Notes (Signed)
Pre visit review using our clinic review tool, if applicable. No additional management support is needed unless otherwise documented below in the visit note. 

## 2013-12-18 NOTE — Progress Notes (Signed)
Subjective:    Patient ID: Sarah [name], female    DOB: 01/14/1924, 80 y.o.   MRN: [id number]  HPI 80 year old female with past history of hypertension and hypercholesterolemia who comes in today for a scheduled follow up.  She was previously admitted with a right non displaced femoral neck fracture.  Is s/p pinning 05/06/12.  She required rehab post hospitalization.  Had home physical therapy. Doing well. Able to ambulate around her house.  Feels good.  Eating and drinking well.  Bowels stable.  While in the hospital, she had a cxr and subsequent CT chest.  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  Indeterminate 3mm pulmonary nodules.  Have discussed this with her.  She declines further w/up.  Reports swallowing ok.  No sob or cough.  Overall feels good.  Handling stress relatively well.  Still seeing Dr Mare FerrariLavine.  She did recently have a fracture - radius.  Seeing Dr Hyacinth MeekerMiller.  Doing well.  Healing well.      Past Medical History  Diagnosis Date  . Arthritis   . Depression   . Glaucoma   . Hypercholesterolemia   . Subclinical hypothyroidism   . Hypertension     Current Outpatient Prescriptions on File Prior to Visit  Medication Sig Dispense Refill  . amLODipine (NORVASC) 5 MG tablet Take 1 tablet (5 mg total) by mouth daily. 30 tablet 5  . fluticasone (FLONASE) 50 MCG/ACT nasal spray Place 2 sprays into both nostrils daily. 16 g 2  . Multiple Vitamins-Minerals (CENTRUM SILVER PO) Take 1 tablet by mouth daily.    Marland Kitchen. venlafaxine XR (EFFEXOR-XR) 150 MG 24 hr capsule Take three tablets by mouth daily     No current facility-administered medications on file prior to visit.    Review of Systems Patient denies any headache, lightheadedness or dizziness.  No sinus or  allergy symptoms.   No chest pain, tightness or palpitations. No increased shortness of breath, cough or congestion.  No acid reflux, dysphagia or odynophagia.  No abdominal pain.  Bowels stable.   Moving around well.   Handling stress well.  Recent fracture of radius.  Healing well.       Objective:   Physical Exam  Filed Vitals:   12/18/13 1103  BP: 130/70  Pulse: 89  Temp: 98.4 F (6136.799 C)   80 year old female in no acute distress.   HEENT:  Nares- clear.  Oropharynx - without lesions. NECK:  Supple.  Nontender.  No audible bruit.  HEART:  Appears to be regular. LUNGS:  No crackles or wheezing audible.  Respirations even and unlabored.  RADIAL PULSE:  Equal bilaterally.   ABDOMEN:  Soft, nontender.  Bowel sounds present and normal.  No audible abdominal bruit.   EXTREMITIES:  No increased edema present.  DP pulses palpable and equal bilaterally.          Assessment & Plan:  1. Essential hypertension Blood pressure doing well.  Follow.   - Comprehensive metabolic panel  2. Subclinical hypothyroidism TSH has been low.  Free T3 and free T4 wnl.  Follow.   - TSH; Future  3. Hyperglycemia Low carb diet.  - Hemoglobin A1c Lab Results  Component Value Date   HGBA1C 6.0 12/18/2013   4. Hypercholesterolemia Low cholesterol diet and exercise.  -  Lipid panel Lab Results  Component Value Date   CHOL 218* 12/18/2013   HDL 54.00 12/18/2013   LDLCALC 132* 12/18/2013   LDLDIRECT 141.7 08/23/2013   TRIG 158.0* 12/18/2013   CHOLHDL 4 12/18/2013   5. Depression Still seeing psychiatry.  Stable.    6. Abnormal chest CT DT as outlined.  Declines any further evaluation or w/up.    7. MSK.  See above. Doing well s/p surgery for femoral neck fracture.  Recent fracture of radius.  Healing well.     HEALTH MAINTENANCE.  Physical 03/09/13.  She declines mammogram, colonoscopy, bone density and breast/pelvic exam.

## 2013-12-18 NOTE — Progress Notes (Signed)
Order placed for f/u thyroid tests.   

## 2013-12-19 ENCOUNTER — Encounter: Payer: Self-pay | Admitting: *Deleted

## 2013-12-23 ENCOUNTER — Encounter: Payer: Self-pay | Admitting: Internal Medicine

## 2013-12-31 DIAGNOSIS — F331 Major depressive disorder, recurrent, moderate: Secondary | ICD-10-CM | POA: Diagnosis not present

## 2014-02-11 ENCOUNTER — Other Ambulatory Visit: Payer: Self-pay | Admitting: *Deleted

## 2014-02-11 MED ORDER — AMLODIPINE BESYLATE 5 MG PO TABS: 5.0000 mg | ORAL_TABLET | Freq: Every day | ORAL | Status: DC

## 2014-04-01 DIAGNOSIS — F331 Major depressive disorder, recurrent, moderate: Secondary | ICD-10-CM | POA: Diagnosis not present

## 2014-04-22 ENCOUNTER — Ambulatory Visit: Payer: Medicare Other | Admitting: Internal Medicine

## 2014-04-25 ENCOUNTER — Ambulatory Visit (INDEPENDENT_AMBULATORY_CARE_PROVIDER_SITE_OTHER): Payer: Medicare Other | Admitting: Internal Medicine

## 2014-04-25 ENCOUNTER — Encounter: Payer: Self-pay | Admitting: Internal Medicine

## 2014-04-25 VITALS — BP 132/75 | HR 92 | Temp 98.5°F | Ht 61.0 in | Wt 137.2 lb

## 2014-04-25 DIAGNOSIS — R739 Hyperglycemia, unspecified: Secondary | ICD-10-CM | POA: Diagnosis not present

## 2014-04-25 DIAGNOSIS — R9389 Abnormal findings on diagnostic imaging of other specified body structures: Secondary | ICD-10-CM

## 2014-04-25 DIAGNOSIS — E038 Other specified hypothyroidism: Secondary | ICD-10-CM | POA: Diagnosis not present

## 2014-04-25 DIAGNOSIS — E7800 Pure hypercholesterolemia, unspecified: Secondary | ICD-10-CM

## 2014-04-25 DIAGNOSIS — I10 Essential (primary) hypertension: Secondary | ICD-10-CM

## 2014-04-25 DIAGNOSIS — R938 Abnormal findings on diagnostic imaging of other specified body structures: Secondary | ICD-10-CM

## 2014-04-25 DIAGNOSIS — E039 Hypothyroidism, unspecified: Secondary | ICD-10-CM

## 2014-04-25 DIAGNOSIS — F32A Depression, unspecified: Secondary | ICD-10-CM

## 2014-04-25 DIAGNOSIS — F329 Major depressive disorder, single episode, unspecified: Secondary | ICD-10-CM

## 2014-04-25 LAB — HEMOGLOBIN A1C: Hgb A1c MFr Bld: 5.9 % (ref 4.6–6.5)

## 2014-04-25 LAB — COMPREHENSIVE METABOLIC PANEL
ALK PHOS: 128 U/L — AB (ref 39–117)
ALT: 16 U/L (ref 0–35)
AST: 18 U/L (ref 0–37)
Albumin: 4.3 g/dL (ref 3.5–5.2)
BILIRUBIN TOTAL: 0.6 mg/dL (ref 0.2–1.2)
BUN: 18 mg/dL (ref 6–23)
CO2: 29 mEq/L (ref 19–32)
Calcium: 9.8 mg/dL (ref 8.4–10.5)
Chloride: 101 mEq/L (ref 96–112)
Creatinine, Ser: 0.79 mg/dL (ref 0.40–1.20)
GFR: 72.92 mL/min (ref >60.00)
Glucose, Bld: 97 mg/dL (ref 70–99)
POTASSIUM: 4.5 meq/L (ref 3.5–5.1)
Sodium: 137 mEq/L (ref 135–145)
TOTAL PROTEIN: 7.3 g/dL (ref 6.0–8.3)

## 2014-04-25 LAB — T3, FREE: T3, Free: 3 pg/mL (ref 2.3–4.2)

## 2014-04-25 LAB — LIPID PANEL
CHOL/HDL RATIO: 4
Cholesterol: 235 mg/dL — ABNORMAL HIGH (ref 0–200)
HDL: 55.8 mg/dL (ref >39.00)
LDL CALC: 141 mg/dL — AB (ref 0–99)
NonHDL: 179.2
Triglycerides: 191 mg/dL — ABNORMAL HIGH (ref 0.0–149.0)
VLDL: 38.2 mg/dL (ref 0.0–40.0)

## 2014-04-25 LAB — T4, FREE: Free T4: 0.96 ng/dL (ref 0.60–1.60)

## 2014-04-25 LAB — TSH: TSH: 0.2 u[IU]/mL — ABNORMAL LOW (ref 0.35–4.50)

## 2014-04-25 NOTE — Progress Notes (Signed)
Pre visit review using our clinic review tool, if applicable. No additional management support is needed unless otherwise documented below in the visit note. 

## 2014-04-25 NOTE — Progress Notes (Signed)
Patient ID: Sarah [name], female   DOB: [birth year], 81 y.o.   MRN: [id number]   Subjective:    Patient ID: Sarah [name], female    DOB: 01/03/1924, 81 y.o.   MRN: [id number]  HPI  Patient here for a scheduled follow up.  States she is doing relatively well.  Stays active.  No cardiac symptoms with increased activity or exertion.  Breathing stable.  Eating and drinking well.  No nausea or vomiting.  No bowel change.     Past Medical History  Diagnosis Date  . Arthritis   . Depression   . Glaucoma   . Hypercholesterolemia   . Subclinical hypothyroidism   . Hypertension     Current Outpatient Prescriptions on File Prior to Visit  Medication Sig Dispense Refill  . amLODipine (NORVASC) 5 MG tablet Take 1 tablet (5 mg total) by mouth daily. 30 tablet 5  . fluticasone (FLONASE) 50 MCG/ACT nasal spray Place 2 sprays into both nostrils daily. 16 g 2  . Multiple Vitamins-Minerals (CENTRUM SILVER PO) Take 1 tablet by mouth daily.    Marland Kitchen venlafaxine XR (EFFEXOR-XR) 150 MG 24 hr capsule Take three tablets by mouth daily     No current facility-administered medications on file prior to visit.    Review of Systems  Constitutional: Negative for appetite change and unexpected weight change.  HENT: Negative for congestion and sinus pressure.   Respiratory: Negative for cough, chest tightness and shortness of breath.   Cardiovascular: Negative for chest pain, palpitations and leg swelling.  Gastrointestinal: Negative for nausea, vomiting, abdominal pain and diarrhea.  Skin: Negative for color change and rash.  Neurological: Negative for dizziness, light-headedness and headaches.  Psychiatric/Behavioral: Negative for dysphoric mood and agitation.       Objective:     Blood pressure recheck;  130/72  Physical Exam  Constitutional: She appears well-developed and well-nourished. No distress.  HENT:  Nose: Nose normal.  Mouth/Throat: Oropharynx is clear and moist.  Neck: Neck  supple. No thyromegaly present.  Cardiovascular: Normal rate and regular rhythm.   Pulmonary/Chest: Breath sounds normal. No respiratory distress. She has no wheezes.  Abdominal: Soft. Bowel sounds are normal. There is no tenderness.  Musculoskeletal: She exhibits no edema or tenderness.  Lymphadenopathy:    She has no cervical adenopathy.  Skin: No rash noted. No erythema.  Psychiatric: She has a normal mood and affect. Her behavior is normal.    BP 132/75 mmHg  Pulse 92  Temp(Src) 98.5 F (36.9 C) (Oral)  Ht 5' 1"  (1.549 m)  Wt 137 lb 4 oz (62.256 kg)  BMI 25.95 kg/m2  SpO2 97% Wt Readings from Last 3 Encounters:  04/25/14 137 lb 4 oz (62.256 kg)  12/18/13 133 lb 4 oz (60.442 kg)  08/14/13 137 lb (62.143 kg)     Lab Results  Component Value Date   WBC 6.4 12/18/2013   HGB 13.1 12/18/2013   HCT 40.2 12/18/2013   PLT 290.0 12/18/2013   GLUCOSE 97 04/25/2014   CHOL 235* 04/25/2014   TRIG 191.0* 04/25/2014   HDL 55.80 04/25/2014   LDLDIRECT 141.7 08/23/2013   LDLCALC 141* 04/25/2014   ALT 16 04/25/2014   AST 18 04/25/2014   NA 137 04/25/2014   K 4.5 04/25/2014   CL 101 04/25/2014   CREATININE 0.79 04/25/2014   BUN 18 04/25/2014   CO2 29 04/25/2014   TSH 0.20* 04/25/2014   HGBA1C 5.9 04/25/2014       Assessment & Plan:  Problem List Items Addressed This Visit    Abnormal chest CT    CT as outlined in previous note.  Declines further w/up.       Depression    Feels she is doing relatively well.  Followed by Dr Clovis Riley.        Hypercholesterolemia    Low cholesterol diet and exercise.  Follow lipid panel.        Relevant Orders   Lipid panel (Completed)   Hyperglycemia    Low carb diet and exercise.  Follow met b and a1c.       Relevant Orders   Hemoglobin A1c (Completed)   Hypertension - Primary    Blood pressure doing well.  Follow pressures.  Same medication regimen. Follow metabolic panel.        Relevant Orders   Comprehensive metabolic  panel (Completed)   Subclinical hypothyroidism    Check tsh and free t3 and free t4.  Follow.       Relevant Orders   TSH (Completed)   T3, free (Completed)   T4, free (Completed)       Einar Pheasant, MD

## 2014-04-26 ENCOUNTER — Encounter: Payer: Self-pay | Admitting: *Deleted

## 2014-04-26 ENCOUNTER — Other Ambulatory Visit: Payer: Self-pay | Admitting: Internal Medicine

## 2014-04-26 DIAGNOSIS — R748 Abnormal levels of other serum enzymes: Secondary | ICD-10-CM

## 2014-04-26 NOTE — Progress Notes (Signed)
Order placed for f/u liver panel.  

## 2014-04-28 ENCOUNTER — Encounter: Payer: Self-pay | Admitting: Internal Medicine

## 2014-04-28 NOTE — Assessment & Plan Note (Signed)
Check tsh and free t3 and free t4.  Follow.

## 2014-04-28 NOTE — Assessment & Plan Note (Signed)
Feels she is doing relatively well.  Followed by Dr Lenis NoonLevine.

## 2014-04-28 NOTE — Assessment & Plan Note (Signed)
Blood pressure doing well.  Follow pressures.  Same medication regimen.  Follow metabolic panel.   

## 2014-04-28 NOTE — Assessment & Plan Note (Signed)
Low cholesterol diet and exercise.  Follow lipid panel.   

## 2014-04-28 NOTE — Assessment & Plan Note (Signed)
Low carb diet and exercise.  Follow met b and a1c.  

## 2014-04-28 NOTE — Assessment & Plan Note (Signed)
CT as outlined in previous note.  Declines further w/up.

## 2014-05-17 NOTE — H&P (Signed)
PATIENT NAME:  Sarah [name], Sarah [name] MR#:  [id number] DATE OF BIRTH:  17-Jul-1923  DATE OF ADMISSION:  05/05/2012  PRIMARY CARE PHYSICIAN: Dale Golconda, MD  REQUESTING PHYSICIAN: Kathreen Devoid, MD  REASON FOR ADMISSION: The patient fell down and broke her right hip. Hospitalist services were contacted for consultation/preoperative clearance.  HISTORY OF PRESENT ILLNESS: The patient is an 81 year old Caucasian female with past medical history significant for history of hypertension, depression, anxiety, history of admission for hyponatremia which was felt to be due to HCTZ, hypothyroidism and hyperlipidemia, who presented to the hospital after a fall. According to the patient, she was doing well up until approximately 3 weeks ago when she was trying to get into her jeans while she was standing and her foot caught the jeans and she fell down. She, however, was able to walk. She would not have too much of pains whenever she was lying or just sitting. However, she would get significant pains in the right hip area as well as groin area whenever she was trying to stand up from a sitting position or whenever she would put weight on that hip; however, she walked for the past 3 weeks. The patient was sent from doctor's office because of known hip fracture. The patient was noted to have a subcapital right hip fracture. Because of medical problems, hospitalist services were contacted for preoperative clearance.   PAST MEDICAL AND SURGICAL HISTORY: Significant for history of hypertension, depression, anxiety, history of hyponatremia which was felt to be due to HCTZ, hypothyroidism, hyperlipidemia, hysterectomy due to dysfunctional uterine bleeding in the past, history of appendectomy, hospitalization for depression in 2002, hemorrhoid surgery.   ALLERGIES: None.   FAMILY HISTORY: Negative for early coronary artery disease, diabetes mellitus. The patient's sister died at age of 26, also from complications of  hip fracture. Brother died at age of 19 from hip fracture complications.   SOCIAL HISTORY: The patient is widowed, lives alone. She never smoked in the past. No alcohol abuse.   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  CONSTITUTIONAL: Otherwise denies fevers, chills, fatigue, weakness, weight loss. EYES: Denies any double vision or glaucoma.  EARS, NOSE, THROAT: Denies any tinnitus, allergies, epistaxis, sinus pain, dentures or difficulty swallowing.  RESPIRATORY: Denies any cough, wheezing, asthma, COPD.  CARDIOVASCULAR: Denies any chest pains, orthopnea, arrhythmias, palpitations or syncope. Denies any chest pains whenever she exerts herself, and she is very active and she goes up and down, up and down in her house.  GASTROINTESTINAL: Denies nausea, vomiting, diarrhea.  GENITOURINARY: Denies dysuria, hematuria, frequency, incontinence. However, here in the Emergency Room, she was noted to have abnormal urinalysis concerning for urinary tract infection.  ENDOCRINE: Denies any polydipsia, nocturia, thyroid problems, heat or cold intolerance or thirst.  HEMATOLOGIC: Denies anemia, easy bruising, bleeding, swollen glands.  SKIN: Denies any acne, rashes, lesions, change in moles..  MUSCULOSKELETAL: Denies arthritis, cramps, swelling, gout.  NEUROLOGIC: No numbness, epilepsy or tremor.  PSYCHIATRIC: Denies anxiety, insomnia or depression.  PHYSICAL EXAMINATION:  VITAL SIGNS: On arrival to the hospital, the patient's temperature was 98.5, pulse 101, respiratory rate 20, blood pressure 159/70, saturation 95% on room air.  GENERAL: This is a well-developed, well-nourished Caucasian female, very animated and active, lying on the stretcher. She is able to move her extremities  and she  is not in significant discomfort.  HEENT: Her pupils are equal, reactive to light. Extraocular movements intact. No icterus or conjunctivitis. Has mild difficulty hearing. No pharyngeal erythema. Mucosa is dry.  NECK: No masses. Supple, nontender. Thyroid not enlarged. No adenopathy. No JVD or carotid bruits bilaterally. Full range of motion.  LUNGS: Clear to auscultation in all fields. No rales, rhonchi, diminished breath sounds or wheezing. No labored respirations, increased work, dullness to percussion. Not in overt respiratory distress.  CARDIOVASCULAR: S1, S2 appreciated. No murmurs, gallops, rubs noted. PMI not lateralized. Chest is nontender to palpation.  EXTREMITIES: Pedal pulses 1+. No lower extremity edema, calf tenderness or cyanosis was noted.  ABDOMEN: Soft, nontender. Bowel sounds are present. No hepatosplenomegaly or masses were noted.  RECTAL: Deferred.  MUSCULOSKELETAL: Muscle strength: Able to move all extremities. However, some weakness due to right hip area discomfort, however, able to move her extremities including flexing at her hip. No cyanosis, degenerative joint disease. Not able to assess for kyphosis. Gait is not tested.  SKIN: Did not reveal any rashes, lesions, erythema, nodularity or induration. It was warm and dry to palpation.  LYMPHATIC: No adenopathy in the cervical region.  NEUROLOGIC: Cranial grossly intact. Sensory is intact. No dysarthria or aphasia. The patient is alert, oriented to person and place, cooperative. Memory is good.  PSYCHIATRIC: No significant confusion, agitation or depression.   LABORATORY AND DIAGNOSTIC DATA: EKG looked normal. The patient does have normal sinus rhythm with premature atrial complexes at 88 beats per minute, normal axis. No acute ST-T changes were noted. The patient's lab data done today on 05/05/2012 showed glucose of 110, sodium 133; otherwise, BMP was unremarkable. The patient's liver enzymes revealed alkaline phosphatase 197,  otherwise liver enzymes normal. CBC within normal limits with white blood cell count 10.1, hemoglobin 13.0, platelet count 336. Coagulation panel within normal limits. Urinalysis revealed yellow clear urine, negative for glucose, bilirubin or ketones, osmolarity 1.008; pH was 6.0; negative for blood, protein, nitrites; 2+ leukocyte esterase, 1 red blood cell, 20 white blood cells, no bacteria were seen; less than 1 epithelial cell as well as mucus was present.   RADIOLOGIC STUDIES: Pelvic AP x-ray 05/05/2012 showed nondisplaced right subcapital fracture. Right hip complete x-ray 05/05/2012 showed nondisplaced right subcapital fracture. Chest portable single view x-ray 05/05/2012 showed mild right basilar opacities, which are likely secondary to atelectasis. However, PA and lateral chest radiographs were recommended. There is widening of the superior mediastinum with deviation of trachea to the right, which may be related to substernal extension or thyroid goiter, although mediastinal mass cannot be excluded. Further evaluation with noncontrast chest CT was recommended.   ASSESSMENT:  1.  Hypertension.  2.  Depression, anxiety. 3.  Hyponatremia. 4.  Hyperlipidemia.  5.  Status post right hip fracture.  6.  Widened mediastinum. 7.  Urinary tract infection.  PLAN:  1.  Hypertension: We will start the patient on metoprolol, as the patient's home medication list is not known at this time.  2.  Anxiety: Start the patient on Xanax as needed.  3.  Hyponatremia: Continue IV fluids. Reassess sodium level in the morning. 4.  Dehydration: IV fluids.  5.  Preoperative cardiovascular evaluation due to right hip fracture. The patient does have minimal criteria for perioperative cardiovascular risk such as hypertension, hyperlipidemia; however, her EKG is normal/unremarkable with no cardiac history and very good activity level. Will start the patient on metoprolol secondary to hypertension as well as tachycardia.  I discussed  risks with family and patient, and she is okay to go to proceed to Operating Room.  6.  Widened mediastinum on chest x-ray: Will get CT scan of her chest to start.  7.  Urinary tract infection: Get urine cultures. Start the patient on Rocephin.   TIME SPENT: One hour.   ____________________________ Katharina Caper, MD rv:jm D: 05/05/2012 18:50:24 ET T: 05/05/2012 19:29:17 ET JOB#: [id number]  cc: Katharina Caper, MD, <Dictator> Dale Smoot, MD Demetris Capell MD ELECTRONICALLY SIGNED 05/28/2012 18:00

## 2014-05-17 NOTE — Discharge Summary (Signed)
PATIENT NAME:  Sarah [name], Sarah [name] MR#:  [id number] DATE OF BIRTH:  1923-02-01  DATE OF ADMISSION:  05/05/2012 DATE OF DISCHARGE:  05/09/2012   ADMITTING DIAGNOSIS: Right nondisplaced femoral neck hip fracture.  HISTORY OF PRESENT ILLNESS: The patient is an 81 year old female who presented to the [facility] from her PCP's office with 3 weeks of right hip pain. At the PCP's office she had x-rays taken, which showed a nondisplaced fracture of the right femoral neck despite her walking on her hip for 3 weeks. The patient was admitted to the orthopedic surgery service for further evaluation and management.   PAST MEDICAL HISTORY: Includes hypertension and depression.   CURRENT MEDICATIONS: Her baseline medications include amlodipine 5 mg daily, Effexor XR 150 mg 3 tabs daily with 2 in the a.m. and 1 in the p.m. and a multivitamin.   ALLERGIES: CODEINE, WHICH CAUSES GI DISTRESS.   HOSPITAL COURSE: The patient was admitted to the orthopedic surgery service on 05/05/2012. She was cleared by the hospitalist service for surgery. She underwent a successful percutaneous pinning of a nondisplaced right femoral neck hip fracture on 05/06/2012. Following surgery, she was brought back to the orthopedic surgery floor. The patient continued 24 hours of postoperative antibiotics. Physical and occupational therapy consults were called for postoperative day #1. The patient did have an episode of sundowning the first night after surgery. This improved throughout her hospitalization. On postoperative day #2, her Foley catheter was removed and her dressings were changed by the orthopedic surgeon. The patient remained neurovascularly intact with a clean incision site throughout her hospitalization. She made good progress with physical therapy and had no other medical complications in her postop course. Given her clinical improvement, she was prepared for discharge to rehab.   DISCHARGE  INSTRUCTIONS: The patient will be touch-toe weight-bearing only on the right lower extremity for a total of 6 weeks postop. This is nonnegotiable. The patient will use a walker for assistance with ambulation. She should have TED stockings on the bilateral lower extremities that are knee high. The patient should receive Lovenox 40 mg subcutaneous daily for DVT prophylaxis. The patient does not tolerate narcotics well. She was placed on Tylenol 1000 mg p.o. q.6 hours p.r.n. for pain and may use tramadol 50 mg p.o. q.6 hours p.r.n. for more severe pain. The patient should be encouraged to continue her incentive spirometry every hour while awake. She will follow up in my office for repeat evaluation and staple removal in 10 to 14 days. She should keep the lower extremity elevated whenever she is in bed or in a chair. She should have daily dressing changes until her incision is completely dry.   DISCHARGE DIAGNOSES:  1.  Right nondisplaced femoral neck hip fracture, status post percutaneous fixation.  2.  Postoperative delirium.  The patient has been also cleared by the hospitalist service for discharge. They have followed her throughout her hospitalization.    ____________________________ Sarah DevoidKevin L. Shanelle Clontz, MD klk:jm D: 05/09/2012 13:14:14 ET T: 05/09/2012 13:40:28 ET JOB#: [id number]  cc: Sarah DevoidKevin L. Haldon Carley, MD, <Dictator> Sarah DevoidKEVIN L Ota Ebersole MD ELECTRONICALLY SIGNED 05/09/2012 23:38

## 2014-05-17 NOTE — Op Note (Signed)
PATIENT NAME:  Sarah [name], Sarah [name] MR#:  [id number] DATE OF BIRTH:  08/06/1923  DATE OF PROCEDURE:  05/06/2012  PREOPERATIVE DIAGNOSIS: Right minimally displaced femoral neck hip fracture.   POSTOPERATIVE DIAGNOSIS: Right minimally displaced femoral neck hip fracture.    PROCEDURE: Percutaneous fixation of right femoral neck hip fracture with 4 x 7.3 Synthes cannulated screws.   SURGEON: Juanell FairlyKevin Aric Jost, MD   ANESTHESIA: Spinal.   ESTIMATED BLOOD LOSS: 50 mL.   COMPLICATIONS: None.   INDICATION FOR THE PROCEDURE: The patient is an 81 year old female with 3 weeks of persistent right hip pain status post fall. The patient was diagnosed with a minimally displaced right femoral neck hip fracture by x-ray at her PCP office and sent to the [facility] for admission. The patient was admitted to the orthopedic surgery service. I explained to the patient what her injury was. I gave her surgical options which included hemiarthroplasty versus percutaneous fixation. The patient had a sister who had died soon after hip arthroplasty surgery, and she wished to proceed with percutaneous pinning. Given the fact that this fracture is minimally displaced and impacted, I felt this was a good choice for surgery. I reviewed the risks and benefits of surgery with the patient.   PROCEDURE NOTE: The patient was signed with the word "yes" over the right hip according to the hospital's right site protocol. I explained the risks and benefits of surgery to the patient's daughters as well. I used a diagram in the patient's room to explain the injury and the proposed surgery. The patient's daughters were in agreement with the surgical plan.   The patient was brought to the Operating Room where she underwent a spinal anesthetic. She was placed supine on the fracture table. Her right leg was placed in a leg holder and the left leg was placed in a hemilithotomy position. All bony prominences were  adequately padded. She was prepped and draped in a sterile fashion.  A timeout was performed to verify the patient's name, date of birth, medical record number, correct site of surgery and correct procedure to be performed. It was also used to verify the patient had received antibiotics and that all appropriate instruments, implants, and radiographic studies were available in the room. Once all in attendance were in agreement, the case began.   C-arm imaging was used to confirm the fracture was well reduced on both the AP and lateral planes. No additional reduction was necessary. A lateral incision was made with a #10 blade. The subcutaneous tissues were dissected carefully with a Metzenbaum scissor and pick-up. A Key elevator was used to remove the subcutaneous tissue from the fascia lata. A deep #10 blade was used to incise the fascia lata. The vastus lateralis was then retracted anteriorly off the intramuscular septum until the lateral cortex of the femur was identified. Four threaded guide pins were then placed through the lateral cortex of the femur across the fracture and into the femoral head. The position of these 4 guide pins were confirmed on AP and lateral C-arm imaging. Once all 4 pins were in adequate position, the guide pins were over-drilled. The depth of these guide pins was measured prior to drilling. Then four 7.3 mm cannulated screws were advanced through the lateral cortex across the fracture site and into the femoral head, taking care not to penetrate through the femoral head or enter into the hip articular joint space. Once all screws were hand tightened, the guide pins were removed. Final C-arm  images of both the AP and lateral projections were taken and sent to our PACS system. The wound was copiously irrigated. The fascia lata was closed with interrupted 0 Vicryl suture. The wound again was copiously irrigated. The subcutaneous tissue was closed with 0 and 2-0 Vicryl, and the skin was  approximated with staples. A dry sterile dressing was applied. The patient was then transferred to a hospital bed and brought to the PAC-U in stable condition. I was scrubbed and present for the entire case, and all sharp and instrument counts were correct at the conclusion of the case.    ____________________________ Kathreen Devoid, MD klk:cb D: 05/08/2012 16:29:02 ET T: 05/08/2012 16:44:20 ET JOB#: [id number]  cc: Kathreen Devoid, MD, <Dictator> Kathreen Devoid MD ELECTRONICALLY SIGNED 05/09/2012 23:39

## 2014-05-17 NOTE — H&P (Signed)
Subjective/Chief Complaint Right hip pain   History of Present Illness 81 y/o female sustained fall 3 weeks ago.  She stumbled while putting jeans on and fell.  She has been attempting to walk on the right hip since the fall.  Today at her PCP's office, given her persistent pain, xrays were taken which demonstrate a nondisplaced femoral neck fracture.   Past Med/Surgical Hx:  HTN:   Depression:   ALLERGIES:  Codeine: GI Distress  NKA: None  HOME MEDICATIONS: Medication Instructions Status  amlodipine 5 mg oral tablet 1  orally once a day  Active  Effexor XR 150 mg oral capsule, extended release 3  orally  daily (two in the am, one in the pm) Active  multivitamin 1 dose(s) orally once a day Active   Review of Systems:  Subjective/Chief Complaint Right hip pain x 3 weeks   Physical Exam:  GEN no acute distress   HEENT PERRL, hearing intact to voice, moist oral mucosa, Oropharynx clear, changes in both eyes consistent with cataract surgery   NECK supple  No masses   RESP normal resp effort  clear BS   CARD regular rate  no murmur  No LE edema  no JVD   ABD denies tenderness  soft  normal BS   LYMPH negative neck   EXTR Patient with right hip pain with log rolling and IR and ER.  No shortening or extrernal rotation deformity.  No calf tenderness or lower leg edema.  NVI with palpable pedal pulses bilaterally.   SKIN normal to palpation   NEURO motor/sensory function intact   PSYCH alert, A+O to time, place, person, good insight   Lab Results: Hepatic:  11-Apr-14 15:51   Bilirubin, Total 0.5  Alkaline Phosphatase  197  SGPT (ALT) 25  SGOT (AST) 26  Total Protein, Serum 8.1  Albumin, Serum 4.1  Routine Chem:  11-Apr-14 15:51   Glucose, Serum  110  BUN 11  Creatinine (comp) 0.62  Sodium, Serum  133  Potassium, Serum 3.8  Chloride, Serum 99  CO2, Serum 28  Calcium (Total), Serum 9.6  Osmolality (calc) 266  eGFR (African American) >60  eGFR (Non-African  American) >60 (eGFR values <60m/min/1.73 m2 may be an indication of chronic kidney disease (CKD). Calculated eGFR is useful in patients with stable renal function. The eGFR calculation will not be reliable in acutely ill patients when serum creatinine is changing rapidly. It is not useful in  patients on dialysis. The eGFR calculation may not be applicable to patients at the low and high extremes of body sizes, pregnant women, and vegetarians.)  Anion Gap  6  Routine UA:  11-Apr-14 15:51   Color (UA) Yellow  Clarity (UA) Clear  Glucose (UA) Negative  Bilirubin (UA) Negative  Ketones (UA) Negative  Specific Gravity (UA) 1.008  Blood (UA) Negative  pH (UA) 6.0  Protein (UA) Negative  Nitrite (UA) Negative  Leukocyte Esterase (UA) 2+ (Result(s) reported on 05 May 2012 at 04:06PM.)  RBC (UA) 1 /HPF  WBC (UA) 20 /HPF  Bacteria (UA) NONE SEEN  Epithelial Cells (UA) <1 /HPF  Mucous (UA) PRESENT (Result(s) reported on 05 May 2012 at 04:06PM.)  Routine Coag:  11-Apr-14 15:51   Activated PTT (APTT) 30.3 (A HCT value >55% may artifactually increase the APTT. In one study, the increase was an average of 19%. Reference: "Effect on Routine and Special Coagulation Testing Values of Citrate Anticoagulant Adjustment in Patients with High HCT Values." American Journal of Clinical  Pathology 9030;092:330-076.)  Prothrombin 13.2  INR 1.0 (INR reference interval applies to patients on anticoagulant therapy. A single INR therapeutic range for coumarins is not optimal for all indications; however, the suggested range for most indications is 2.0 - 3.0. Exceptions to the INR Reference Range may include: Prosthetic heart valves, acute myocardial infarction, prevention of myocardial infarction, and combinations of aspirin and anticoagulant. The need for a higher or lower target INR must be assessed individually. Reference: The Pharmacology and Management of the Vitamin K  antagonists: the seventh  ACCP Conference on Antithrombotic and Thrombolytic Therapy. AUQJF.3545 Sept:126 (3suppl): [id number]. A HCT value >55% may artifactually increase the PT.  In one study,  the increase was an average of 25%. Reference:  "Effect on Routine and Special Coagulation Testing Values of Citrate Anticoagulant Adjustment in Patients with High HCT Values." American Journal of Clinical Pathology 2006;126:400-405.)  Routine Hem:  11-Apr-14 15:51   WBC (CBC) 10.1  RBC (CBC) 4.38  Hemoglobin (CBC) 13.0  Hematocrit (CBC) 38.7  Platelet Count (CBC) 336 (Result(s) reported on 05 May 2012 at 04:12PM.)  MCV 88  MCH 29.7  MCHC 33.6  RDW 12.5   Radiology Results: XRay:    11-Apr-14 15:47, Hip Right Complete  Hip Right Complete  REASON FOR EXAM:    pain s/p fall  COMMENTS:       PROCEDURE: DXR - DXR HIP RIGHT COMPLETE  - May 05 2012  3:47PM     RESULT: Comparison:  None    Findings:    AP and frogleg lateral view of the right hip demonstrates a nondisplaced   right subcapitellar fracture. There is no dislocation. The joint space is   maintained.    IMPRESSION:   Nondisplaced right subcapital or fracture.    Dictation Site: 1        Verified By: Jennette Banker, M.D., MD    11-Apr-14 15:47, Pelvis AP Only  Pelvis AP Only  REASON FOR EXAM:    pain s/p fall  COMMENTS:       PROCEDURE: DXR - DXR PELVIS AP ONLY  - May 05 2012  3:47PM     RESULT: History: Pain    Comparison: None    Findings:    AP pelvis demonstrates a nondisplaced right subcapital fracture. There is   generalized osteopenia. There is no left hip fracture.    IMPRESSION:   Nondisplaced right subcapital fracture.        Verified By: Jennette Banker, M.D., MD  LabUnknown:    11-Apr-14 15:47, Hip Right Complete  PACS Image    11-Apr-14 15:47, Pelvis AP Only  PACS Image    Assessment/Admission Diagnosis Right nondisplaced femoral neck hip fracture   Plan Patient is seen in the ED with a neighbor.  Her daughters  are on their way to the hospital.  I explained to the patient that she has a femoral neck hip fracture which has minimal displacement on both the AP and lateral views.  I have recommended surgical treatment given her persistent pain x 3 weeks.  We discussed surgical options which included nonoperative management, percutaneous fixation and hemiarthoplasty.  Patient explains that her sister had hip surgery and died within weeks of surgery.  Patient has concerns about surgery and wants to avoid hemiarthoplasty at this time.  She would like to proceed with percutaneous fixation.  I am admitting her to the hospital on my service.  She will be NPO after midnight and will have surgical clearance by the  hospitalist service.  I have reviewed all labs and radiographs personally.  The OR schedule as of now will be available for this patient tomorrow at 11 AM.  I have answered all her questions.  I have explained to the patient the risks of percutaneous fixation including AVN and fracture displacement, hardware failure, nonunion/malunion and the need for further surgery.  I will wait to have patient sign consent until her daughters arrive and I have an opportunity to discuss the plan and risks of the procedure with them.   Electronic Signatures: Thornton Park (MD)  (Signed 11-Apr-14 18:06)  Authored: CHIEF COMPLAINT and HISTORY, PAST MEDICAL/SURGIAL HISTORY, ALLERGIES, HOME MEDICATIONS, REVIEW OF SYSTEMS, PHYSICAL EXAM, LABS, Radiology, ASSESSMENT AND PLAN   Last Updated: 11-Apr-14 18:06 by Thornton Park (MD)

## 2014-05-17 NOTE — Consult Note (Signed)
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   Patient was seen by consultant.   Consult note dictated.   Recommend to proceed with surgery or procedure.   Orders entered.   Discussed with Attending MD.   Comments: 1. HTN, start metoprolol, as home medication list is not known 2. anxiety, xanax prn 3. hyponatremai, IVF 4 dehydration, IVf 5. pre-operative cardiovascular evaluation, pt is s/p R hip fx, pt has minimal criteria for perioperative cardiovascular risks, HTn, Hyperlipidemia, but EKG is OK, no cardiac history with very good activity level, will start metoprolol 2/2 HTN, tachycardia, discussed with family and patient about risks, OK to procede to OR 6 widened mediastinum, get STAT CTof chest 7. UTI, get cx, start rocephin Will follow, thanks for consult.  Electronic Signatures: Katharina CaperVaickute, Zariah Cavendish (MD)  (Signed 11-Apr-14 18:51)  Authored: Brief Consult Note   Last Updated: 11-Apr-14 18:51 by Katharina CaperVaickute, Shalyn Koral (MD)

## 2014-05-17 NOTE — Consult Note (Signed)
PATIENT NAME:  Sarah [name], Sarah [name] MR#:  [id number] DATE OF BIRTH:  05-22-1923  DATE OF CONSULTATION:  05/05/2012  REFERRING PHYSICIAN:   CONSULTING PHYSICIAN:  Quentin Orealph L. Ely III, MD  PRIMARY CARE PHYSICIAN: Dr. Lorin PicketScott.   ADMITTING PHYSICIAN: Dr. Martha ClanKrasinski.   CHIEF COMPLAINT: Right hip pain.   BRIEF HISTORY: [name] is an 81 year old woman, who fell 3 weeks ago. She was trying to get into a pair of pants and fell over. She was able to walk but continued to have moderate pain in her right hip and groin area. She went to the doctor's office and was noted to have a subcapsular right hip fracture. She was admitted to the hospital for an elective repair.   PAST MEDICAL HISTORY: Significant for hypertension, hypothyroidism, history of hyponatremia.   PAST SURGICAL HISTORY: Includes appendectomy and hemorrhoid surgery.   SOCIAL HISTORY: She is not a cigarette smoker. She does not use alcohol. She lives alone.   ALLERGIES: She has no medical allergies.   CURRENT MEDICATIONS: Include:  1.  Amlodipine 5 mg p.o. once a day.  2.  Effexor-XR 150 mg p.o. b.i.d.  3.  Xanax 0.5 mg p.o. b.i.d. p.r.n.   REVIEW OF SYSTEMS: Otherwise unremarkable. Specifically, she denies any stridor, respiratory insufficiency, chest pain or dysphagia.   PHYSICAL EXAMINATION: GENERAL: She is an alert, pleasant woman, quite comfortable, in moderate distress from pain.  VITAL SIGNS: Stable with a blood pressure of 183/74, heart rate of 82 and regular, respiratory rate 18 and regular, temperature 98.7, oxygen saturation 98% on room air.  HEENT: No scleral icterus. No pupillary abnormalities. No facial deformities.  NECK: Supple, nontender with slightly enlarged thyroid gland. Thyroid gland is nontender. Trachea appears to be midline.  CHEST: Clear with no adventitious sounds and normal pulmonary excursion.  CARDIAC EXAM: No murmurs or gallops to my ear. She seems to be in normal sinus rhythm.  ABDOMEN:  Benign with no rebound, guarding or abdominal discomfort.  EXTREMITY EXAM: Limited by her right hip fracture.  PSYCHIATRIC EXAM: Normal orientation, normal affect.   RADIOLOGIC DATA: I have independently reviewed her CT scan and chest x-ray. Chest x-ray did demonstrate some widening in her mediastinum with some mild deviation of her trachea to the left side. A CT scan performed on the chest demonstrated what appeared to be a mediastinal mass on the left side with moderate deviation of the trachea.   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 She does have a daughter with a history of a substernal goiter. The patient carries a diagnosis of hypothyroidism, and has been on and off thyroid medicine in the past. At the present time, she is not taking any thyroid medication.   I do not see any indication to avoid surgical repair of her hip fracture at this point. She has no symptoms of esophageal or tracheal compression. This lesion does not appear to be malignant. Further workup with biopsy or ultrasound evaluation is possibilities, but in view of the fact that she is asymptomatic and 88, I would not investigate this lesion any further. We discussed this plan with the patient and her family. I would agree with the planned hip repair.   ____________________________ Carmie Endalph L. Ely III, MD rle:lg D: 05/05/2012 23:17:00 ET T: 05/06/2012 13:00:12 ET JOB#: [id number]  cc: Carmie Endalph L. Ely III, MD, <Dictator> Kathreen DevoidKevin L. Krasinski, MD Quentin OreALPH L ELY MD ELECTRONICALLY SIGNED 05/06/2012 23:15

## 2014-05-24 ENCOUNTER — Encounter: Payer: Self-pay | Admitting: Internal Medicine

## 2014-05-24 ENCOUNTER — Ambulatory Visit (INDEPENDENT_AMBULATORY_CARE_PROVIDER_SITE_OTHER): Payer: Medicare Other | Admitting: Internal Medicine

## 2014-05-24 VITALS — BP 106/60 | HR 71 | Temp 98.8°F | Ht 61.0 in | Wt 135.1 lb

## 2014-05-24 DIAGNOSIS — R938 Abnormal findings on diagnostic imaging of other specified body structures: Secondary | ICD-10-CM

## 2014-05-24 DIAGNOSIS — J069 Acute upper respiratory infection, unspecified: Secondary | ICD-10-CM

## 2014-05-24 DIAGNOSIS — I10 Essential (primary) hypertension: Secondary | ICD-10-CM | POA: Diagnosis not present

## 2014-05-24 DIAGNOSIS — F32A Depression, unspecified: Secondary | ICD-10-CM

## 2014-05-24 DIAGNOSIS — F329 Major depressive disorder, single episode, unspecified: Secondary | ICD-10-CM

## 2014-05-24 DIAGNOSIS — R9389 Abnormal findings on diagnostic imaging of other specified body structures: Secondary | ICD-10-CM

## 2014-05-24 DIAGNOSIS — R443 Hallucinations, unspecified: Secondary | ICD-10-CM

## 2014-05-24 MED ORDER — CEFDINIR 300 MG PO CAPS: 300.0000 mg | ORAL_CAPSULE | Freq: Two times a day (BID) | ORAL | Status: DC

## 2014-05-24 NOTE — Patient Instructions (Signed)
Saline nasal spray - flush nose at least 2-3x/day  nasacort nasal  Spray - 2 sprays each nostil one time per day.  Do this in the evening.    Robitussin

## 2014-05-24 NOTE — Progress Notes (Signed)
Patient ID: Sarah [name], female   DOB: 1923/03/02, 81 y.o.   MRN: [id number]   Subjective:    Patient ID: Sarah [name], female    DOB: January 24, 1924, 81 y.o.   MRN: [id number]  HPI  Patient here as a work in with concerns regarding some increased cough and congestion.  States symptoms started over the last week. Reports increased congestion - ears.  Increased drainage.  Some increased cough and chest congestion.  Occasional wheezing.  No sob.  No chest tightness.  Her nephew brought her today.  Is concerned about Sarah [name] thinking.  States she was concerned about an intruder in her house.  Her nephew checked out the house and could find no evidence of an intruder.  States she mentioned this am (after waking), that she would like to go home.  States was at home.  Her daughter stayed with her over the last few days.  I confronted her about the above.  She denies an intruder.  She did report that she commented on wanting to go home.  She does not know why she said this.  States it may have been that her daughter had been there and went home last pm.  Thought she may have got confused - related to this.  She denies any problems living by herself.  Did state she did not think she needed to be driving.  I agree and we discussed this and discussed with her nephew.     Past Medical History  Diagnosis Date  . Arthritis   . Depression   . Glaucoma   . Hypercholesterolemia   . Subclinical hypothyroidism   . Hypertension     Current Outpatient Prescriptions on File Prior to Visit  Medication Sig Dispense Refill  . amLODipine (NORVASC) 5 MG tablet Take 1 tablet (5 mg total) by mouth daily. 30 tablet 5  . fluticasone (FLONASE) 50 MCG/ACT nasal spray Place 2 sprays into both nostrils daily. 16 g 2  . Multiple Vitamins-Minerals (CENTRUM SILVER PO) Take 1 tablet by mouth daily.    Marland Kitchen venlafaxine XR (EFFEXOR-XR) 150 MG 24 hr capsule Take three tablets by mouth daily     No current  facility-administered medications on file prior to visit.    Review of Systems  Constitutional: Negative for appetite change and unexpected weight change.  HENT: Positive for congestion (nasal congestion) and postnasal drip. Negative for sinus pressure.   Respiratory: Positive for cough. Negative for chest tightness and shortness of breath.        Increased chest congestion and cough.    Cardiovascular: Negative for chest pain and palpitations.  Gastrointestinal: Negative for nausea, vomiting and diarrhea.  Skin: Negative for color change and rash.  Neurological: Negative for dizziness, light-headedness and headaches.  Hematological: Negative for adenopathy. Does not bruise/bleed easily.  Psychiatric/Behavioral: Negative for dysphoric mood.       Nephew reports some confusion.  Question of hallucinations.         Objective:     Blood pressure recheck:  124/62  Physical Exam  Constitutional: She appears well-developed and well-nourished. No distress.  HENT:  Mouth/Throat: Oropharynx is clear and moist.  Nares - slightly erythematous turbinates.  No sinus tenderness to palpation.  TMs visualized - no erythema.   Eyes: Right eye exhibits no discharge. Left eye exhibits no discharge. No scleral icterus.  Neck: Neck supple.  Cardiovascular: Normal rate and regular rhythm.   Pulmonary/Chest: Breath sounds normal. No respiratory distress. She has no  wheezes.  Abdominal: Soft. Bowel sounds are normal. There is no tenderness.  Musculoskeletal: She exhibits no edema or tenderness.  Lymphadenopathy:    She has no cervical adenopathy.  Skin: No rash noted. No erythema.    BP 106/60 mmHg  Pulse 71  Temp(Src) 98.8 F (37.1 C) (Oral)  Ht 5\' 1"  (1.549 m)  Wt 135 lb 2 oz (61.292 kg)  BMI 25.54 kg/m2  SpO2 97% Wt Readings from Last 3 Encounters:  05/24/14 135 lb 2 oz (61.292 kg)  04/25/14 137 lb 4 oz (62.256 kg)  12/18/13 133 lb 4 oz (60.442 kg)     Lab Results  Component Value  Date   WBC 6.4 12/18/2013   HGB 13.1 12/18/2013   HCT 40.2 12/18/2013   PLT 290.0 12/18/2013   GLUCOSE 97 04/25/2014   CHOL 235* 04/25/2014   TRIG 191.0* 04/25/2014   HDL 55.80 04/25/2014   LDLDIRECT 141.7 08/23/2013   LDLCALC 141* 04/25/2014   ALT 16 04/25/2014   AST 18 04/25/2014   NA 137 04/25/2014   K 4.5 04/25/2014   CL 101 04/25/2014   CREATININE 0.79 04/25/2014   BUN 18 04/25/2014   CO2 29 04/25/2014   TSH 0.20* 04/25/2014   INR 1.0 05/05/2012   HGBA1C 5.9 04/25/2014       Assessment & Plan:   Problem List Items Addressed This Visit    Abnormal chest CT    CT as outlined in previous note.  She has declined further w/up.  She continues to decline.        Depression    Has been seeing Dr Mare FerrariLavine.  With increased confusion lately.  Treat current infection.  She declines scanning or further w/up.  Did agree to f/u with Dr Mare FerrariLavine.  Arrange appt.        Relevant Medications   ALPRAZolam (XANAX) 0.5 MG tablet   Other Relevant Orders   Ambulatory referral to Psychiatry   Hallucinations    Declines further w/up (including scanning, etc).  To see Dr Mare FerrariLavine.  Follow.  Discussed with nephew.        Relevant Orders   Ambulatory referral to Psychiatry   Hypertension    Blood pressure doing well.  Same medication regimen.  Follow pressures.        URI (upper respiratory infection) - Primary    Increased cough and congestion as outlined.  Treat with robitussin as directed.  Saline nasal spray and nasacort nasal spray as directed.  omnicef 300mg  bid x 10 days.  Rest.  Fluids.  Explained to her if symptoms change, worsen or do not resolve - needs to be evaluated.        Relevant Medications   cefdinir (OMNICEF) 300 MG capsule     I spent 25 minutes with the patient and more than 50% of the time was spent in consultation regarding the above.     Sarah [name]

## 2014-05-26 ENCOUNTER — Encounter: Payer: Self-pay | Admitting: Internal Medicine

## 2014-05-26 DIAGNOSIS — J069 Acute upper respiratory infection, unspecified: Secondary | ICD-10-CM | POA: Insufficient documentation

## 2014-05-26 DIAGNOSIS — R443 Hallucinations, unspecified: Secondary | ICD-10-CM | POA: Insufficient documentation

## 2014-05-26 NOTE — Assessment & Plan Note (Signed)
Increased cough and congestion as outlined.  Treat with robitussin as directed.  Saline nasal spray and nasacort nasal spray as directed.  omnicef 300mg  bid x 10 days.  Rest.  Fluids.  Explained to her if symptoms change, worsen or do not resolve - needs to be evaluated.

## 2014-05-26 NOTE — Assessment & Plan Note (Signed)
Blood pressure doing well.  Same medication regimen.  Follow pressures.   

## 2014-05-26 NOTE — Assessment & Plan Note (Signed)
Has been seeing Dr Mare FerrariLavine.  With increased confusion lately.  Treat current infection.  She declines scanning or further w/up.  Did agree to f/u with Dr Mare FerrariLavine.  Arrange appt.

## 2014-05-26 NOTE — Assessment & Plan Note (Signed)
Declines further w/up (including scanning, etc).  To see Dr Mare FerrariLavine.  Follow.  Discussed with nephew.

## 2014-05-26 NOTE — Assessment & Plan Note (Signed)
CT as outlined in previous note.  She has declined further w/up.  She continues to decline.

## 2014-05-27 ENCOUNTER — Encounter: Payer: Self-pay | Admitting: Emergency Medicine

## 2014-05-27 ENCOUNTER — Emergency Department: Payer: Medicare Other

## 2014-05-27 ENCOUNTER — Emergency Department
Admission: EM | Admit: 2014-05-27 | Discharge: 2014-05-27 | Disposition: A | Discharge status: Home / Self Care | Payer: Medicare Other | Attending: Emergency Medicine | Admitting: Emergency Medicine

## 2014-05-27 DIAGNOSIS — Z7951 Long term (current) use of inhaled steroids: Secondary | ICD-10-CM | POA: Diagnosis not present

## 2014-05-27 DIAGNOSIS — R5383 Other fatigue: Secondary | ICD-10-CM | POA: Diagnosis not present

## 2014-05-27 DIAGNOSIS — K5900 Constipation, unspecified: Secondary | ICD-10-CM | POA: Diagnosis not present

## 2014-05-27 DIAGNOSIS — I10 Essential (primary) hypertension: Secondary | ICD-10-CM | POA: Diagnosis not present

## 2014-05-27 DIAGNOSIS — F419 Anxiety disorder, unspecified: Secondary | ICD-10-CM | POA: Diagnosis not present

## 2014-05-27 DIAGNOSIS — Z79899 Other long term (current) drug therapy: Secondary | ICD-10-CM | POA: Insufficient documentation

## 2014-05-27 DIAGNOSIS — F99 Mental disorder, not otherwise specified: Secondary | ICD-10-CM | POA: Diagnosis not present

## 2014-05-27 DIAGNOSIS — G4700 Insomnia, unspecified: Secondary | ICD-10-CM | POA: Insufficient documentation

## 2014-05-27 DIAGNOSIS — R05 Cough: Secondary | ICD-10-CM | POA: Diagnosis not present

## 2014-05-27 DIAGNOSIS — F418 Other specified anxiety disorders: Secondary | ICD-10-CM

## 2014-05-27 LAB — URINALYSIS COMPLETE WITH MICROSCOPIC (ARMC ONLY)
Bacteria, UA: NONE SEEN
Bilirubin Urine: NEGATIVE
Glucose, UA: NEGATIVE mg/dL
Hgb urine dipstick: NEGATIVE
Ketones, ur: NEGATIVE mg/dL
Leukocytes, UA: NEGATIVE
Nitrite: NEGATIVE
PH: 5 (ref 5.0–8.0)
PROTEIN: 30 mg/dL — AB
RBC / HPF: NONE SEEN RBC/hpf (ref 0–5)
Specific Gravity, Urine: 1.01 (ref 1.005–1.030)
Squamous Epithelial / LPF: NONE SEEN

## 2014-05-27 LAB — CBC
HEMATOCRIT: 43.4 % (ref 35.0–47.0)
HEMOGLOBIN: 14.4 g/dL (ref 12.0–16.0)
MCH: 28.7 pg (ref 26.0–34.0)
MCHC: 33.1 g/dL (ref 32.0–36.0)
MCV: 86.8 fL (ref 80.0–100.0)
Platelets: 339 10*3/uL (ref 150–440)
RBC: 5 MIL/uL (ref 3.80–5.20)
RDW: 13 % (ref 11.5–14.5)
WBC: 5 10*3/uL (ref 3.6–11.0)

## 2014-05-27 LAB — COMPREHENSIVE METABOLIC PANEL
ALK PHOS: 139 U/L — AB (ref 38–126)
ALT: 26 U/L (ref 14–54)
AST: 28 U/L (ref 15–41)
Albumin: 4.3 g/dL (ref 3.5–5.0)
Anion gap: 10 (ref 5–15)
BILIRUBIN TOTAL: 0.6 mg/dL (ref 0.3–1.2)
BUN: 10 mg/dL (ref 6–20)
CO2: 26 mmol/L (ref 22–32)
Calcium: 9.2 mg/dL (ref 8.9–10.3)
Chloride: 103 mmol/L (ref 101–111)
Creatinine, Ser: 0.7 mg/dL (ref 0.44–1.00)
GFR calc non Af Amer: >60 mL/min (ref >60)
Glucose, Bld: 189 mg/dL — ABNORMAL HIGH (ref 65–99)
POTASSIUM: 3.3 mmol/L — AB (ref 3.5–5.1)
SODIUM: 139 mmol/L (ref 135–145)
Total Protein: 8.1 g/dL (ref 6.5–8.1)

## 2014-05-27 LAB — TROPONIN I

## 2014-05-27 NOTE — Discharge Instructions (Signed)

## 2014-05-27 NOTE — ED Notes (Signed)
Patient reports generalized fatigue and "not feeling like herself" for last 2 weeks. Reports having bad dreams and poor sleep in that time frame. Also reports no BM in 2 weeks. Denies any pain.

## 2014-05-27 NOTE — ED Provider Notes (Signed)
[facility] Emergency Department Provider Note    ____________________________________________  Time seen: 11:45 AM  I have reviewed the triage vital signs and the nursing notes.   HISTORY  Chief Complaint Fatigue; Insomnia; and Constipation       HPI Sarah [name] is a 81 y.o. female who complains of generalized fatigue for approximately 2 weeks. She said that since she lives alone and she thought she would come to have someone check on her. She denies chest pain fevers chills nausea or vomiting. She states that she is just now getting over a cold. She denies actually weakness or numbness. No headache.     Past Medical History  Diagnosis Date  . Arthritis   . Depression   . Glaucoma   . Hypercholesterolemia   . Subclinical hypothyroidism   . Hypertension     Patient Active Problem List   Diagnosis Date Noted  . URI (upper respiratory infection) 05/26/2014  . Hallucinations 05/26/2014  . Abnormal chest CT 07/24/2012  . Hypertension 11/03/2011  . Hyperglycemia 11/03/2011  . Subclinical hypothyroidism 11/03/2011  . Hypercholesterolemia 11/03/2011  . Depression 11/03/2011    Past Surgical History  Procedure Laterality Date  . Abdominal hysterectomy  1978  . Hip fracture surgery Right     Current Outpatient Rx  Name  Route  Sig  Dispense  Refill  . ALPRAZolam (XANAX) 0.5 MG tablet      daily as needed.      0   . amLODipine (NORVASC) 5 MG tablet   Oral   Take 1 tablet (5 mg total) by mouth daily.   30 tablet   5   . cefdinir (OMNICEF) 300 MG capsule   Oral   Take 1 capsule (300 mg total) by mouth 2 (two) times daily.   20 capsule   0   . fluticasone (FLONASE) 50 MCG/ACT nasal spray   Each Nare   Place 2 sprays into both nostrils daily.   16 g   2   . latanoprost (XALATAN) 0.005 % ophthalmic solution            0   . Multiple Vitamins-Minerals (CENTRUM SILVER PO)   Oral   Take 1 tablet by mouth daily.        . timolol (TIMOPTIC-XR) 0.5 % ophthalmic gel-forming            0   . venlafaxine XR (EFFEXOR-XR) 150 MG 24 hr capsule      Take three tablets by mouth daily           Allergies Codeine and Morphine and related  Family History  Problem Relation Age of Onset  . Alcohol abuse Mother   . Arthritis Mother   . Hyperlipidemia Mother   . Hypertension Mother   . Alcohol abuse Father   . Arthritis Father   . Hyperlipidemia Father   . Hypertension Father     Social History History  Substance Use Topics  . Smoking status: Never Smoker   . Smokeless tobacco: Never Used  . Alcohol Use: No    Review of Systems  Constitutional: Negative for fever. Recent cold now improved Eyes: Negative for visual changes. ENT: Negative for sore throat. Cardiovascular: Negative for chest pain. Respiratory: Negative for shortness of breath. Gastrointestinal: Negative for abdominal pain, vomiting and diarrhea. Genitourinary: Negative for dysuria. Musculoskeletal: Negative for back pain. Skin: Negative for rash. Neurological: Negative for headaches, focal weakness or numbness. Psychiatric:Denies depression   10-point ROS otherwise  negative.  ____________________________________________   PHYSICAL EXAM:  VITAL SIGNS: ED Triage Vitals  Enc Vitals Group     BP 05/27/14 1035 200/81 mmHg     Pulse Rate 05/27/14 1035 92     Resp 05/27/14 1035 14     Temp 05/27/14 1032 98.5 F (36.9 C)     Temp Source 05/27/14 1032 Oral     SpO2 05/27/14 1035 98 %     Weight 05/27/14 1035 135 lb 6.4 oz (61.417 kg)     Height 05/27/14 1035 5\' 2"  (1.575 m)     Head Cir --      Peak Flow --      Pain Score --      Pain Loc --      Pain Edu? --      Excl. in GC? --      Constitutional: Alert and oriented. Well appearing and in no distress. Eyes: Conjunctivae are normal. PERRL. Normal extraocular movements. ENT   Head: Normocephalic and atraumatic.   Nose: No  congestion/rhinnorhea.   Mouth/Throat: Mucous membranes are moist.   Neck: No stridor. Hematological/Lymphatic/Immunilogical: No cervical lymphadenopathy. Cardiovascular: Normal rate, regular rhythm. Normal and symmetric distal pulses are present in all extremities. No murmurs, rubs, or gallops. Respiratory: Normal respiratory effort without tachypnea nor retractions. Breath sounds are clear and equal bilaterally. No wheezes/rales/rhonchi. Gastrointestinal: Soft and nontender. No distention. No abdominal bruits. There is no CVA tenderness. Genitourinary: Deferred Musculoskeletal: Nontender with normal range of motion in all extremities. No joint effusions.  No lower extremity tenderness nor edema. Neurologic:  Normal speech and language. No gross focal neurologic deficits are appreciated. Speech is normal. No gait instability. Skin:  Skin is warm, dry and intact. No rash noted. Psychiatric: Mood and affect are normal. Speech and behavior are normal. Patient exhibits appropriate insight and judgment.  ____________________________________________   EKG   Date: 05/27/2014  Rate: 90  Rhythm: normal sinus rhythm  QRS Axis: normal  Intervals: normal  ST/T Wave abnormalities: normal  Conduction Disutrbances: none  Narrative Interpretation: unremarkable      ____________________________________________    RADIOLOGY  No acute distress on chest x-ray  ____________________________________________   PROCEDURES  Procedure(s) performed: None  Critical Care performed: No  ____________________________________________   INITIAL IMPRESSION / ASSESSMENT AND PLAN / ED COURSE  Pertinent labs & imaging results that were available during my care of the patient were reviewed by me and considered in my medical decision making (see chart for details).  Patient well-appearing is no specific complaints besides generalized fatigue. Suspect a component of anxiety in her coming to the  emergency department today. Reassured her given her normal labs and workup and have asked her to follow up with her primary care provider  ____________________________________________   FINAL CLINICAL IMPRESSION(S) / ED DIAGNOSES  Final diagnoses:  Other fatigue  Anxiety about health     Jene Everyobert Berlinda Farve, MD 05/27/14 1221

## 2014-06-26 ENCOUNTER — Other Ambulatory Visit (INDEPENDENT_AMBULATORY_CARE_PROVIDER_SITE_OTHER): Payer: Medicare Other

## 2014-06-26 DIAGNOSIS — R748 Abnormal levels of other serum enzymes: Secondary | ICD-10-CM

## 2014-06-26 LAB — HEPATIC FUNCTION PANEL
ALT: 17 U/L (ref 0–35)
AST: 23 U/L (ref 0–37)
Albumin: 4.3 g/dL (ref 3.5–5.2)
Alkaline Phosphatase: 124 U/L — ABNORMAL HIGH (ref 39–117)
BILIRUBIN TOTAL: 0.6 mg/dL (ref 0.2–1.2)
Bilirubin, Direct: 0.1 mg/dL (ref 0.0–0.3)
TOTAL PROTEIN: 7.8 g/dL (ref 6.0–8.3)

## 2014-06-27 ENCOUNTER — Encounter: Payer: Self-pay | Admitting: *Deleted

## 2014-07-14 ENCOUNTER — Telehealth: Payer: Self-pay | Admitting: Internal Medicine

## 2014-07-14 NOTE — Telephone Encounter (Signed)
Pt also has a contact person - [place] [phone].  See encounter message.  Brought her to her appt.

## 2014-08-02 DIAGNOSIS — H3531 Nonexudative age-related macular degeneration: Secondary | ICD-10-CM | POA: Diagnosis not present

## 2014-08-08 ENCOUNTER — Other Ambulatory Visit: Payer: Self-pay | Admitting: *Deleted

## 2014-08-08 MED ORDER — AMLODIPINE BESYLATE 5 MG PO TABS: 5.0000 mg | ORAL_TABLET | Freq: Every day | ORAL | Status: DC

## 2014-08-26 ENCOUNTER — Ambulatory Visit: Payer: Medicare Other | Admitting: Internal Medicine

## 2014-08-26 DIAGNOSIS — F331 Major depressive disorder, recurrent, moderate: Secondary | ICD-10-CM | POA: Diagnosis not present

## 2014-08-31 ENCOUNTER — Other Ambulatory Visit: Payer: Self-pay

## 2014-08-31 ENCOUNTER — Emergency Department
Admission: EM | Admit: 2014-08-31 | Discharge: 2014-09-01 | Disposition: A | Discharge status: Home / Self Care | Payer: Medicare Other | Attending: Emergency Medicine | Admitting: Emergency Medicine

## 2014-08-31 DIAGNOSIS — Z79899 Other long term (current) drug therapy: Secondary | ICD-10-CM | POA: Diagnosis not present

## 2014-08-31 DIAGNOSIS — R1310 Dysphagia, unspecified: Secondary | ICD-10-CM | POA: Diagnosis not present

## 2014-08-31 DIAGNOSIS — I10 Essential (primary) hypertension: Secondary | ICD-10-CM | POA: Diagnosis not present

## 2014-08-31 DIAGNOSIS — R1010 Upper abdominal pain, unspecified: Secondary | ICD-10-CM | POA: Insufficient documentation

## 2014-08-31 DIAGNOSIS — Z7951 Long term (current) use of inhaled steroids: Secondary | ICD-10-CM | POA: Diagnosis not present

## 2014-08-31 DIAGNOSIS — R110 Nausea: Secondary | ICD-10-CM | POA: Diagnosis not present

## 2014-08-31 DIAGNOSIS — R1013 Epigastric pain: Secondary | ICD-10-CM | POA: Diagnosis not present

## 2014-08-31 DIAGNOSIS — R630 Anorexia: Secondary | ICD-10-CM | POA: Insufficient documentation

## 2014-08-31 DIAGNOSIS — R1110 Vomiting, unspecified: Secondary | ICD-10-CM | POA: Diagnosis present

## 2014-08-31 LAB — CBC
HEMATOCRIT: 40.2 % (ref 35.0–47.0)
HEMOGLOBIN: 13.7 g/dL (ref 12.0–16.0)
MCH: 29.5 pg (ref 26.0–34.0)
MCHC: 34.1 g/dL (ref 32.0–36.0)
MCV: 86.4 fL (ref 80.0–100.0)
PLATELETS: 283 10*3/uL (ref 150–440)
RBC: 4.65 MIL/uL (ref 3.80–5.20)
RDW: 12.8 % (ref 11.5–14.5)
WBC: 7.2 10*3/uL (ref 3.6–11.0)

## 2014-08-31 LAB — COMPREHENSIVE METABOLIC PANEL
ALK PHOS: 121 U/L (ref 38–126)
ALT: 21 U/L (ref 14–54)
ANION GAP: 11 (ref 5–15)
AST: 26 U/L (ref 15–41)
Albumin: 4.5 g/dL (ref 3.5–5.0)
BILIRUBIN TOTAL: 0.6 mg/dL (ref 0.3–1.2)
BUN: 14 mg/dL (ref 6–20)
CALCIUM: 9.7 mg/dL (ref 8.9–10.3)
CHLORIDE: 99 mmol/L — AB (ref 101–111)
CO2: 26 mmol/L (ref 22–32)
CREATININE: 0.63 mg/dL (ref 0.44–1.00)
GFR calc non Af Amer: >60 mL/min (ref >60)
Glucose, Bld: 140 mg/dL — ABNORMAL HIGH (ref 65–99)
Potassium: 3.9 mmol/L (ref 3.5–5.1)
Sodium: 136 mmol/L (ref 135–145)
TOTAL PROTEIN: 8.1 g/dL (ref 6.5–8.1)

## 2014-08-31 LAB — LIPASE, BLOOD: Lipase: 19 U/L — ABNORMAL LOW (ref 22–51)

## 2014-08-31 MED ORDER — ONDANSETRON 4 MG PO TBDP
4.0000 mg | ORAL_TABLET | Freq: Once | ORAL | Status: AC | PRN
Start: 2014-08-31 — End: 2014-08-31
  Administered 2014-08-31: 4 mg via ORAL
  Filled 2014-08-31: qty 1

## 2014-08-31 NOTE — ED Notes (Signed)
Pt brought to triage via wheelchair. Pt reports started about 2 days ago with pain going through her upper abd into her back and then she vomits up what she describes as mucous. Tonight pt reports sx seem worse. Pt denies fever or diarrhea.

## 2014-09-01 ENCOUNTER — Emergency Department: Payer: Medicare Other

## 2014-09-01 DIAGNOSIS — R1013 Epigastric pain: Secondary | ICD-10-CM | POA: Diagnosis not present

## 2014-09-01 DIAGNOSIS — R1310 Dysphagia, unspecified: Secondary | ICD-10-CM | POA: Diagnosis not present

## 2014-09-01 DIAGNOSIS — R110 Nausea: Secondary | ICD-10-CM | POA: Diagnosis not present

## 2014-09-01 LAB — URINALYSIS COMPLETE WITH MICROSCOPIC (ARMC ONLY)
BILIRUBIN URINE: NEGATIVE
Glucose, UA: NEGATIVE mg/dL
Hgb urine dipstick: NEGATIVE
Nitrite: NEGATIVE
Protein, ur: NEGATIVE mg/dL
Specific Gravity, Urine: 1.013 (ref 1.005–1.030)
pH: 7 (ref 5.0–8.0)

## 2014-09-01 MED ORDER — ONDANSETRON 4 MG PO TBDP
ORAL_TABLET | ORAL | Status: AC
  Administered 2014-09-01: 4 mg via ORAL
  Filled 2014-09-01: qty 1

## 2014-09-01 MED ORDER — GLUCAGON HCL RDNA (DIAGNOSTIC) 1 MG IJ SOLR
INTRAMUSCULAR | Status: AC
  Administered 2014-09-01: 1 mg via INTRAVENOUS
  Filled 2014-09-01: qty 1

## 2014-09-01 MED ORDER — GLUCAGON HCL (RDNA) 1 MG IJ SOLR
1.0000 mg | Freq: Once | INTRAMUSCULAR | Status: DC
  Administered 2014-09-01: 1 mg via INTRAVENOUS

## 2014-09-01 MED ORDER — ONDANSETRON 4 MG PO TBDP
4.0000 mg | ORAL_TABLET | Freq: Once | ORAL | Status: AC
  Administered 2014-09-01: 4 mg via ORAL

## 2014-09-01 MED ORDER — GLUCAGON HCL (RDNA) 1 MG IJ SOLR
1.0000 mg | Freq: Once | INTRAMUSCULAR | Status: AC
  Administered 2014-09-01: 1 mg via INTRAMUSCULAR

## 2014-09-01 NOTE — Discharge Instructions (Signed)
Please seek medical attention for any high fevers, chest pain, shortness of breath, change in behavior, persistent vomiting, bloody stool or any other new or concerning symptoms. Achalasia (What you might have... Please follow up with the GI doctor) Achalasia is a condition in which a person cannot get food through the lower esophagus. This is the tube that carries food from your mouth to your stomach. This happens because there are no nerves to the lower esophagus and the esophageal sphincter. This is the circular muscle between the stomach and esophagus that relaxes to allow food into the stomach. It then contracts to keep food in the stomach. This absence of nerves may be present since birth. This condition causes difficulty swallowing, chest pain, and food coming back up in the mouth after being swallowed. DIAGNOSIS  This condition is diagnosed by x-ray, pressure studies in the esophagus, and endoscopy. This is when your caregiver looks into your esophagus with a small flexible telescope. The portion of the esophagus above the narrowing is usually enlarged when the condition is present. TREATMENT   Soft diets are helpful.  Medications will help the food pass more easily into the stomach.  If conservative treatment (as above) does not work, stretching the end of the esophagus with a balloon may help.  Sometimes surgical treatment is used and a segment of esophagus is removed. SEEK IMMEDIATE MEDICAL CARE IF:  You are unable to keep fluids down or it feels as though food sticks in your chest area.  Vomiting becomes persistent.  Chest or belly pain develops, increases, or localizes.  You have a fever. If problems are continuing and not allowing you to live a normal lifestyle, talk with your caregiver and discuss medical means to help this. Document Released: 10/21/2004 Document Revised: 04/05/2011 Document Reviewed: 02/03/2006 [facility] Patient Information 2015 [place], [place]. This  information is not intended to replace advice given to you by your health care provider. Make sure you discuss any questions you have with your health care provider.

## 2014-09-01 NOTE — ED Notes (Signed)
Pt. Was given about 3 oz of water, pt. Spit water back up.

## 2014-09-01 NOTE — ED Notes (Signed)
Pt. States she has been vomiting small amounts for the past 3 days.  Pt. States today she had some chicken for lunch and feels like "I swallowed too much"  Pt. States after eating chicken she has not eaten anything else.  Pt. States unable to hold down liquids without vomiting soon afterwards.  Pt. States sore in upper gastric area.

## 2014-09-01 NOTE — ED Notes (Signed)
Pt. Going home with friend 

## 2014-09-01 NOTE — ED Notes (Signed)
Pt. Going home with family. 

## 2014-09-01 NOTE — ED Provider Notes (Signed)
[facility] Emergency Department Provider Note  ____________________________________________  Time seen: 0048  I have reviewed the triage vital signs and the nursing notes.   HISTORY  Chief Complaint Abdominal Pain and Emesis   History limited by: Not Limited   HPI Sarah [name] is a 81 y.o. female who presents to the emergency department today because of concerns for difficulty swallowing. Patient states that she has had multiple episodes of throwing up and spitting up for roughly 3 days. She states that she is mainly spitting up mucous type substance. She had been able to eat although had decreased appetite yesterday. She states she has had a little bit of upper abdominal discomfort with this. She denies any fevers.     Past Medical History  Diagnosis Date  . Arthritis   . Depression   . Glaucoma   . Hypercholesterolemia   . Subclinical hypothyroidism   . Hypertension     Patient Active Problem List   Diagnosis Date Noted  . URI (upper respiratory infection) 05/26/2014  . Hallucinations 05/26/2014  . Abnormal chest CT 07/24/2012  . Hypertension 11/03/2011  . Hyperglycemia 11/03/2011  . Subclinical hypothyroidism 11/03/2011  . Hypercholesterolemia 11/03/2011  . Depression 11/03/2011    Past Surgical History  Procedure Laterality Date  . Abdominal hysterectomy  1978  . Hip fracture surgery Right     Current Outpatient Rx  Name  Route  Sig  Dispense  Refill  . ALPRAZolam (XANAX) 0.5 MG tablet   Oral   Take 0.5 mg by mouth daily as needed.       0   . amLODipine (NORVASC) 5 MG tablet   Oral   Take 1 tablet (5 mg total) by mouth daily.   30 tablet   5   . latanoprost (XALATAN) 0.005 % ophthalmic solution            0   . Multiple Vitamins-Minerals (CENTRUM SILVER PO)   Oral   Take 1 tablet by mouth daily.         . timolol (TIMOPTIC-XR) 0.5 % ophthalmic gel-forming            0   . venlafaxine XR  (EFFEXOR-XR) 150 MG 24 hr capsule      Take three tablets by mouth daily         . cefdinir (OMNICEF) 300 MG capsule   Oral   Take 1 capsule (300 mg total) by mouth 2 (two) times daily.   20 capsule   0   . fluticasone (FLONASE) 50 MCG/ACT nasal spray   Each Nare   Place 2 sprays into both nostrils daily.   16 g   2     Allergies Codeine and Morphine and related  Family History  Problem Relation Age of Onset  . Alcohol abuse Mother   . Arthritis Mother   . Hyperlipidemia Mother   . Hypertension Mother   . Alcohol abuse Father   . Arthritis Father   . Hyperlipidemia Father   . Hypertension Father     Social History History  Substance Use Topics  . Smoking status: Never Smoker   . Smokeless tobacco: Never Used  . Alcohol Use: No    Review of Systems  Constitutional: Negative for fever. Cardiovascular: Negative for chest pain. Respiratory: Negative for shortness of breath. Gastrointestinal: Negative for abdominal pain, vomiting and diarrhea. Genitourinary: Negative for dysuria. Musculoskeletal: Negative for back pain. Skin: Negative for rash. Neurological: Negative for headaches, focal weakness  or numbness.   10-point ROS otherwise negative.  ____________________________________________   PHYSICAL EXAM:  VITAL SIGNS: ED Triage Vitals  Enc Vitals Group     BP 08/31/14 2155 165/79 mmHg     Pulse Rate 08/31/14 2155 84     Resp 08/31/14 2155 18     Temp 08/31/14 2155 97.9 F (36.6 C)     Temp Source 08/31/14 2155 Oral     SpO2 08/31/14 2155 97 %     Weight 08/31/14 2155 135 lb (61.236 kg)     Height 08/31/14 2155 5\' 1"  (1.549 m)   Constitutional: Alert and oriented. Well appearing and in no distress. Eyes: Conjunctivae are normal. PERRL. Normal extraocular movements. ENT   Head: Normocephalic and atraumatic.   Nose: No congestion/rhinnorhea.   Mouth/Throat: Mucous membranes are moist.   Neck: No  stridor. Hematological/Lymphatic/Immunilogical: No cervical lymphadenopathy. Cardiovascular: Normal rate, regular rhythm.  No murmurs, rubs, or gallops. Respiratory: Normal respiratory effort without tachypnea nor retractions. Breath sounds are clear and equal bilaterally. No wheezes/rales/rhonchi. Gastrointestinal: Soft and nontender. No distention.  Genitourinary: Deferred Musculoskeletal: Normal range of motion in all extremities. No joint effusions.  No lower extremity tenderness nor edema. Neurologic:  Normal speech and language. No gross focal neurologic deficits are appreciated. Speech is normal.  Skin:  Skin is warm, dry and intact. No rash noted. Psychiatric: Mood and affect are normal. Speech and behavior are normal. Patient exhibits appropriate insight and judgment.  ____________________________________________    LABS (pertinent positives/negatives)  Labs Reviewed  LIPASE, BLOOD - Abnormal; Notable for the following:    Lipase 19 (*)    All other components within normal limits  COMPREHENSIVE METABOLIC PANEL - Abnormal; Notable for the following:    Chloride 99 (*)    Glucose, Bld 140 (*)    All other components within normal limits  URINALYSIS COMPLETEWITH MICROSCOPIC ([facility] ONLY) - Abnormal; Notable for the following:    Color, Urine YELLOW (*)    APPearance HAZY (*)    Ketones, ur 1+ (*)    Leukocytes, UA 1+ (*)    Bacteria, UA RARE (*)    Squamous Epithelial / LPF 0-5 (*)    All other components within normal limits  CBC     ____________________________________________   EKG  I, Phineas Semen, attending physician, personally viewed and interpreted this EKG  EKG Time: 2236 Rate: 86 Rhythm: normal sinus rhythm Axis: normal Intervals: qtc 433 QRS: narrow ST changes: no st elevation  ____________________________________________    RADIOLOGY  CXR IMPRESSION: Unchanged rightward deviation of the trachea, likely due to a goiter. No acute  cardiopulmonary findings.  ____________________________________________   PROCEDURES  Procedure(s) performed: None  Critical Care performed: No  ____________________________________________   INITIAL IMPRESSION / ASSESSMENT AND PLAN / ED COURSE  Pertinent labs & imaging results that were available during my care of the patient were reviewed by me and considered in my medical decision making (see chart for details).  Patient presents to the emergency department today with couple day history of difficulty swallowing. Chest x-ray did not show any abnormalities. Initially Zofran was attempted however patient still cannot by mouth. Glucagon was given afterwards patient was able to drink without spitting up. She stated she felt much better. At this point I'm not sure she had an impaction or she has some esophageal motility disorder. I did strongly advise follow-up with GI. Return precautions given  ____________________________________________   FINAL CLINICAL IMPRESSION(S) / ED DIAGNOSES  Final diagnoses:  Difficulty swallowing  Phineas Semen, MD 09/01/14 [phone]

## 2014-09-02 ENCOUNTER — Telehealth: Payer: Self-pay | Admitting: Internal Medicine

## 2014-09-02 NOTE — Telephone Encounter (Signed)
Please block the 11:00 spots on 09/04/14 - 30 minute spot.  Please call pt back and see if she can come in this week.  Just for evaluation and then can get back with her guests.  Thanks.

## 2014-09-02 NOTE — Telephone Encounter (Signed)
Pt needs a CPE. Advised pt the next appt is this Wednesday 8/10, but pt states not a good day. The next available is 10/10. Pt wants an appt next doctor and does not want to see another doctor. Thank You!

## 2014-09-02 NOTE — Telephone Encounter (Signed)
Spoke with pt, she states she would try to get here on 8.10.16 at 11 am.  Pt scheduled

## 2014-09-02 NOTE — Telephone Encounter (Signed)
The patient is wanting to follow up with Dr. Lorin Picket next week. I offered her an appointment for this week ,but she has guest.

## 2014-09-02 NOTE — Telephone Encounter (Signed)
Please advise appoint date and time.

## 2014-09-04 ENCOUNTER — Ambulatory Visit (INDEPENDENT_AMBULATORY_CARE_PROVIDER_SITE_OTHER): Payer: Medicare Other | Admitting: Internal Medicine

## 2014-09-04 ENCOUNTER — Encounter: Payer: Self-pay | Admitting: Internal Medicine

## 2014-09-04 VITALS — BP 132/70 | HR 92 | Temp 98.9°F | Ht 61.0 in | Wt 134.8 lb

## 2014-09-04 DIAGNOSIS — E038 Other specified hypothyroidism: Secondary | ICD-10-CM | POA: Diagnosis not present

## 2014-09-04 DIAGNOSIS — F32A Depression, unspecified: Secondary | ICD-10-CM

## 2014-09-04 DIAGNOSIS — R1310 Dysphagia, unspecified: Secondary | ICD-10-CM

## 2014-09-04 DIAGNOSIS — F329 Major depressive disorder, single episode, unspecified: Secondary | ICD-10-CM

## 2014-09-04 DIAGNOSIS — I10 Essential (primary) hypertension: Secondary | ICD-10-CM | POA: Diagnosis not present

## 2014-09-04 DIAGNOSIS — R9389 Abnormal findings on diagnostic imaging of other specified body structures: Secondary | ICD-10-CM

## 2014-09-04 DIAGNOSIS — R938 Abnormal findings on diagnostic imaging of other specified body structures: Secondary | ICD-10-CM | POA: Diagnosis not present

## 2014-09-04 DIAGNOSIS — E039 Hypothyroidism, unspecified: Secondary | ICD-10-CM

## 2014-09-04 LAB — TSH: TSH: 0.17 u[IU]/mL — ABNORMAL LOW (ref 0.35–4.50)

## 2014-09-04 LAB — T4, FREE: Free T4: 0.93 ng/dL (ref 0.60–1.60)

## 2014-09-04 LAB — T3, FREE: T3 FREE: 3.3 pg/mL (ref 2.3–4.2)

## 2014-09-04 NOTE — Progress Notes (Signed)
Patient ID: Sarah [name], female   DOB: 1923-12-18, 81 y.o.   MRN: [id number]   Subjective:    Patient ID: Sarah [name], female    DOB: 1923/07/21, 81 y.o.   MRN: [id number]  HPI  Patient here as a work in for ER follow up.  She was evaluated in ER 08/31/14 with difficulty swallowing.  States she was eating chicken when the episode occurred.  Per ER note, was spitting up for roughly three days.  See ER note for details.  She reports that in the ER - they were able to get the meat out and she has been ok since.  Reports has just been eating soft food.  She does not have her bottom teeth.  Her friend through them away.  Not able to chew as well.  No pain with swallowing.  No acid reflux.  States she feels fine now.  No abdominal pain or cramping.  Bowels stable.  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  She declined further w/up at that time and has continued to decline further w/up.  She again declines today.  We discussed repeating CT.  She wants to hold on any further testing at this time.  Desires for me not to discuss her decision with her daughter.     Past Medical History  Diagnosis Date  . Arthritis   . Depression   . Glaucoma   . Hypercholesterolemia   . Subclinical hypothyroidism   . Hypertension    Family history and social history reviewed.    Outpatient Encounter Prescriptions as of 09/04/2014  Medication Sig  . ALPRAZolam (XANAX) 0.5 MG tablet Take 0.5 mg by mouth daily as needed.   Marland Kitchen amLODipine (NORVASC) 5 MG tablet Take 1 tablet (5 mg total) by mouth daily.  . fluticasone (FLONASE) 50 MCG/ACT nasal spray Place 2 sprays into [name] nostrils daily.  Marland Kitchen latanoprost (XALATAN) 0.005 % ophthalmic solution   . Multiple Vitamins-Minerals (CENTRUM SILVER PO) Take 1 tablet by mouth daily.  . timolol (TIMOPTIC-XR) 0.5 % ophthalmic gel-forming   . venlafaxine XR (EFFEXOR-XR) 150 MG 24  hr capsule Take three tablets by mouth daily  . [DISCONTINUED] cefdinir (OMNICEF) 300 MG capsule Take 1 capsule (300 mg total) by mouth 2 (two) times daily.   No facility-administered encounter medications on file as of 09/04/2014.    Review of Systems  Constitutional: Negative for appetite change and unexpected weight change.  HENT: Negative for congestion and sinus pressure.   Respiratory: Negative for cough, chest tightness and shortness of breath.   Cardiovascular: Negative for chest pain, palpitations and leg swelling.  Gastrointestinal: Negative for nausea, vomiting, abdominal pain and diarrhea.  Musculoskeletal: Negative for back pain and joint swelling.  Skin: Negative for color change and rash.  Neurological: Negative for dizziness, light-headedness and headaches.  Hematological: Negative for adenopathy. Does not bruise/bleed easily.  Psychiatric/Behavioral: Negative for dysphoric mood and agitation.       Objective:     Blood pressure rechecked by me:  130/62  Physical Exam  Constitutional: She appears well-developed and well-nourished. No distress.  HENT:  Nose: Nose normal.  Mouth/Throat: Oropharynx is clear and moist.  Neck: Neck supple. Thyromegaly present.  Cardiovascular: Normal rate and regular rhythm.   Pulmonary/Chest: Breath sounds normal. No respiratory distress. She has no wheezes.  Abdominal: Soft. Bowel sounds are normal. There is no tenderness.  Musculoskeletal: She exhibits  no edema or tenderness.  Lymphadenopathy:    She has no cervical adenopathy.  Skin: No rash noted. No erythema.  Psychiatric: She has a normal mood and affect. Her behavior is normal.    BP 132/70 mmHg  Pulse 92  Temp(Src) 98.9 F (37.2 C) (Oral)  Ht  (1.549 m)  Wt 134 lb 12.8 oz (61.145 kg)  BMI 25.48 kg/m2  SpO2 95% Wt Readings from Last 3 Encounters:  09/04/14 134 lb 12.8 oz (61.145 kg)  08/31/14 135 lb (61.236 kg)  05/27/14 135 lb 6.4 oz (61.417 kg)     Lab  Results  Component Value Date   WBC 7.2 08/31/2014   HGB 13.7 08/31/2014   HCT 40.2 08/31/2014   PLT 283 08/31/2014   GLUCOSE 140* 08/31/2014   CHOL 235* 04/25/2014   TRIG 191.0* 04/25/2014   HDL 55.80 04/25/2014   LDLDIRECT 141.7 08/23/2013   LDLCALC 141* 04/25/2014   ALT 21 08/31/2014   AST 26 08/31/2014   NA 136 08/31/2014   K 3.9 08/31/2014   CL 99* 08/31/2014   CREATININE 0.63 08/31/2014   BUN 14 08/31/2014   CO2 26 08/31/2014   TSH 0.17* 09/04/2014   INR 1.0 05/05/2012   HGBA1C 5.9 04/25/2014    Dg Chest 2 View  09/01/2014   CLINICAL DATA:  Epigastric pain and nausea.  EXAM: CHEST  2 VIEW  COMPARISON:  05/27/2014  FINDINGS: Rightward tracheal deviation again noted without significant change, likely due to thyroid goiter. The lungs are clear. There are no effusions. Pulmonary vasculature is normal. Heart size is normal.  IMPRESSION: Unchanged rightward deviation of the trachea, likely due to a goiter. No acute cardiopulmonary findings.   Electronically Signed   By: Ellery Plunk M.D.   On: 09/01/2014 02:20       Assessment & Plan:   Problem List Items Addressed This Visit    Abnormal chest CT    CT as outlined.  She has declined and continues to decline further w/up.  Discussed f/u CT.  She declines.  May consider in future.  Recheck soon.        Depression    Sees Dr Mare Ferrari.  Doing better now.  On effexor.  Follow.       Dysphagia    ER visit as outlined.  CXR reviewed and as outlined.  Previous CT as outlined.  She is swallowing better now.  Eating soft foods.  Discussed need for further evaluation and w/up.  She declines.  Doing better.  Get her back in soon to reassess.  Follow.        Hypertension    Blood pressure under good control.  Continue same medication regimen.  Follow pressures.  Follow metabolic panel.        Subclinical hypothyroidism - Primary    Previous CT as outlined.  Check thyroid function tests.        Relevant Orders   T3, free  (Completed)   T4, free (Completed)   TSH (Completed)       Dale Reform, MD

## 2014-09-04 NOTE — Progress Notes (Signed)
Pre visit review using our clinic review tool, if applicable. No additional management support is needed unless otherwise documented below in the visit note. 

## 2014-09-05 ENCOUNTER — Encounter: Payer: Self-pay | Admitting: *Deleted

## 2014-09-07 ENCOUNTER — Encounter: Payer: Self-pay | Admitting: Internal Medicine

## 2014-09-07 DIAGNOSIS — R1310 Dysphagia, unspecified: Secondary | ICD-10-CM | POA: Insufficient documentation

## 2014-09-07 NOTE — Assessment & Plan Note (Signed)
CT as outlined.  She has declined and continues to decline further w/up.  Discussed f/u CT.  She declines.  May consider in future.  Recheck soon.

## 2014-09-07 NOTE — Assessment & Plan Note (Signed)
Previous CT as outlined.  Check thyroid function tests.

## 2014-09-07 NOTE — Assessment & Plan Note (Signed)
Sees Dr Mare Ferrari.  Doing better now.  On effexor.  Follow.

## 2014-09-07 NOTE — Assessment & Plan Note (Signed)
Blood pressure under good control.  Continue same medication regimen.  Follow pressures.  Follow metabolic panel.   

## 2014-09-07 NOTE — Assessment & Plan Note (Signed)
ER visit as outlined.  CXR reviewed and as outlined.  Previous CT as outlined.  She is swallowing better now.  Eating soft foods.  Discussed need for further evaluation and w/up.  She declines.  Doing better.  Get her back in soon to reassess.  Follow.

## 2014-09-23 ENCOUNTER — Ambulatory Visit (INDEPENDENT_AMBULATORY_CARE_PROVIDER_SITE_OTHER): Payer: Medicare Other | Admitting: Internal Medicine

## 2014-09-23 ENCOUNTER — Encounter: Payer: Self-pay | Admitting: Internal Medicine

## 2014-09-23 VITALS — BP 137/70 | HR 90 | Temp 98.4°F | Ht 61.0 in | Wt 140.5 lb

## 2014-09-23 DIAGNOSIS — R1310 Dysphagia, unspecified: Secondary | ICD-10-CM

## 2014-09-23 DIAGNOSIS — F329 Major depressive disorder, single episode, unspecified: Secondary | ICD-10-CM

## 2014-09-23 DIAGNOSIS — E039 Hypothyroidism, unspecified: Secondary | ICD-10-CM

## 2014-09-23 DIAGNOSIS — R938 Abnormal findings on diagnostic imaging of other specified body structures: Secondary | ICD-10-CM | POA: Diagnosis not present

## 2014-09-23 DIAGNOSIS — I10 Essential (primary) hypertension: Secondary | ICD-10-CM | POA: Diagnosis not present

## 2014-09-23 DIAGNOSIS — R9389 Abnormal findings on diagnostic imaging of other specified body structures: Secondary | ICD-10-CM

## 2014-09-23 DIAGNOSIS — E038 Other specified hypothyroidism: Secondary | ICD-10-CM

## 2014-09-23 DIAGNOSIS — F32A Depression, unspecified: Secondary | ICD-10-CM

## 2014-09-23 NOTE — Progress Notes (Signed)
Patient ID: Sarah [name], female   DOB: 1923/07/16, 81 y.o.   MRN: [id number]   Subjective:    Patient ID: Sarah [name], female    DOB: January 09, 1924, 81 y.o.   MRN: [id number]  HPI  Patient here for a scheduled follow.  States she is eating.  Has someone who stays with her.  She reports having no further episodes of feeling like food is getting stuck.  See last note for details.  She has refused further evaluation with CT, GI referral, etc.  Desires no further testing.  Reports no nausea or vomiting.  Bowels stable.  No abdominal pain or cramping.     Past Medical History  Diagnosis Date  . Arthritis   . Depression   . Glaucoma   . Hypercholesterolemia   . Subclinical hypothyroidism   . Hypertension    Past Surgical History  Procedure Laterality Date  . Abdominal hysterectomy  1978  . Hip fracture surgery Right    Family History  Problem Relation Age of Onset  . Alcohol abuse Mother   . Arthritis Mother   . Hyperlipidemia Mother   . Hypertension Mother   . Alcohol abuse Father   . Arthritis Father   . Hyperlipidemia Father   . Hypertension Father    Social History   Social History  . Marital Status: Widowed    Spouse Name: N/A  . Number of Children: N/A  . Years of Education: N/A   Social History Main Topics  . Smoking status: Never Smoker   . Smokeless tobacco: Never Used  . Alcohol Use: No  . Drug Use: No  . Sexual Activity: Not Currently   Other Topics Concern  . None   Social History Narrative    Outpatient Encounter Prescriptions as of 09/23/2014  Medication Sig  . ALPRAZolam (XANAX) 0.5 MG tablet Take 0.5 mg by mouth daily as needed.   Marland Kitchen amLODipine (NORVASC) 5 MG tablet Take 1 tablet (5 mg total) by mouth daily.  . fluticasone (FLONASE) 50 MCG/ACT nasal spray Place 2 sprays into both nostrils daily.  Marland Kitchen latanoprost (XALATAN) 0.005 % ophthalmic solution   . Multiple Vitamins-Minerals (CENTRUM SILVER PO) Take 1 tablet by mouth daily.  .  timolol (TIMOPTIC-XR) 0.5 % ophthalmic gel-forming   . venlafaxine XR (EFFEXOR-XR) 150 MG 24 hr capsule Take three tablets by mouth daily   No facility-administered encounter medications on file as of 09/23/2014.    Review of Systems  Constitutional: Negative for appetite change and unexpected weight change (weight is up from last check. ).  HENT: Negative for congestion and sinus pressure.   Respiratory: Negative for cough, chest tightness and shortness of breath.   Cardiovascular: Negative for chest pain, palpitations and leg swelling.  Gastrointestinal: Negative for nausea, vomiting, abdominal pain and diarrhea.  Musculoskeletal: Negative for back pain and joint swelling.  Skin: Negative for color change and rash.  Neurological: Negative for dizziness, light-headedness and headaches.  Psychiatric/Behavioral: Negative for dysphoric mood and agitation.       Objective:     Blood pressure rechecked by me:  130/68  Physical Exam  Constitutional: She appears well-developed and well-nourished. No distress.  HENT:  Nose: Nose normal.  Mouth/Throat: Oropharynx is clear and moist.  Eyes: Conjunctivae are normal. Right eye exhibits no discharge. Left eye exhibits no discharge.  Neck: Neck supple.  Cardiovascular: Normal rate and regular rhythm.   Pulmonary/Chest: Breath sounds normal. No respiratory distress. She has no wheezes.  Abdominal: Soft. Bowel  sounds are normal. There is no tenderness.  Musculoskeletal: She exhibits no edema or tenderness.  Lymphadenopathy:    She has no cervical adenopathy.  Skin: No rash noted. No erythema.  Psychiatric: She has a normal mood and affect. Her behavior is normal.    BP 137/70 mmHg  Pulse 90  Temp(Src) 98.4 F (36.9 C) (Oral)  Ht 5\' 1"  (1.549 m)  Wt 140 lb 8 oz (63.73 kg)  BMI 26.56 kg/m2  SpO2 97% Wt Readings from Last 3 Encounters:  09/23/14 140 lb 8 oz (63.73 kg)  09/04/14 134 lb 12.8 oz (61.145 kg)  08/31/14 135 lb (61.236 kg)       Lab Results  Component Value Date   WBC 7.2 08/31/2014   HGB 13.7 08/31/2014   HCT 40.2 08/31/2014   PLT 283 08/31/2014   GLUCOSE 140* 08/31/2014   CHOL 235* 04/25/2014   TRIG 191.0* 04/25/2014   HDL 55.80 04/25/2014   LDLDIRECT 141.7 08/23/2013   LDLCALC 141* 04/25/2014   ALT 21 08/31/2014   AST 26 08/31/2014   NA 136 08/31/2014   K 3.9 08/31/2014   CL 99* 08/31/2014   CREATININE 0.63 08/31/2014   BUN 14 08/31/2014   CO2 26 08/31/2014   TSH 0.17* 09/04/2014   INR 1.0 05/05/2012   HGBA1C 5.9 04/25/2014    Dg Chest 2 View  09/01/2014   CLINICAL DATA:  Epigastric pain and nausea.  EXAM: CHEST  2 VIEW  COMPARISON:  05/27/2014  FINDINGS: Rightward tracheal deviation again noted without significant change, likely due to thyroid goiter. The lungs are clear. There are no effusions. Pulmonary vasculature is normal. Heart size is normal.  IMPRESSION: Unchanged rightward deviation of the trachea, likely due to a goiter. No acute cardiopulmonary findings.   Electronically Signed   By: Ellery Plunk M.D.   On: 09/01/2014 02:20       Assessment & Plan:   Problem List Items Addressed This Visit    Abnormal chest CT    CT reviewed.  She declines f/u scanning and w/up.  Has declined and continues to decline.        Depression    Sees Dr Mare Ferrari.  Feels - stable.        Dysphagia    Recent ER visit as outlined in last note.  CXR reviewed and as outlined.  Previous CT reviewed.  She is swallowing better now.  Eating.  Again discussed the need for further w/up and evaluation.  She declines.  She declines any further scanning or referral.        Hypertension - Primary    Blood pressure under good control.  Continue same medication regimen.  Follow pressures.  Follow metabolic panel.        Subclinical hypothyroidism    Recent thyroid function tests ok.  Follow.            Dale [place], MD

## 2014-09-23 NOTE — Progress Notes (Signed)
Pre-visit discussion using our clinic review tool. No additional management support is needed unless otherwise documented below in the visit note.  

## 2014-09-29 ENCOUNTER — Encounter: Payer: Self-pay | Admitting: Internal Medicine

## 2014-09-30 ENCOUNTER — Encounter: Payer: Self-pay | Admitting: Internal Medicine

## 2014-09-30 NOTE — Assessment & Plan Note (Signed)
CT reviewed.  She declines f/u scanning and w/up.  Has declined and continues to decline.

## 2014-09-30 NOTE — Assessment & Plan Note (Signed)
Blood pressure under good control.  Continue same medication regimen.  Follow pressures.  Follow metabolic panel.   

## 2014-09-30 NOTE — Assessment & Plan Note (Signed)
Recent thyroid function tests ok.  Follow.

## 2014-09-30 NOTE — Assessment & Plan Note (Signed)
Recent ER visit as outlined in last note.  CXR reviewed and as outlined.  Previous CT reviewed.  She is swallowing better now.  Eating.  Again discussed the need for further w/up and evaluation.  She declines.  She declines any further scanning or referral.

## 2014-09-30 NOTE — Assessment & Plan Note (Signed)
Sees Dr Mare Ferrari.  Feels - stable.

## 2014-12-16 DIAGNOSIS — F331 Major depressive disorder, recurrent, moderate: Secondary | ICD-10-CM | POA: Diagnosis not present

## 2014-12-26 ENCOUNTER — Ambulatory Visit: Payer: Medicare Other | Admitting: Internal Medicine

## 2015-01-22 ENCOUNTER — Encounter: Payer: Self-pay | Admitting: *Deleted

## 2015-01-30 DIAGNOSIS — H4010X2 Unspecified open-angle glaucoma, moderate stage: Secondary | ICD-10-CM | POA: Diagnosis not present

## 2015-01-31 ENCOUNTER — Other Ambulatory Visit: Payer: Self-pay | Admitting: Internal Medicine

## 2015-02-19 DIAGNOSIS — H401132 Primary open-angle glaucoma, bilateral, moderate stage: Secondary | ICD-10-CM | POA: Diagnosis not present

## 2015-04-07 ENCOUNTER — Encounter: Payer: Self-pay | Admitting: Internal Medicine

## 2015-04-07 ENCOUNTER — Ambulatory Visit (INDEPENDENT_AMBULATORY_CARE_PROVIDER_SITE_OTHER): Payer: Medicare Other | Admitting: Internal Medicine

## 2015-04-07 VITALS — BP 132/70 | HR 83 | Temp 98.0°F | Resp 18 | Ht 61.0 in | Wt 136.5 lb

## 2015-04-07 DIAGNOSIS — F32A Depression, unspecified: Secondary | ICD-10-CM

## 2015-04-07 DIAGNOSIS — E039 Hypothyroidism, unspecified: Secondary | ICD-10-CM

## 2015-04-07 DIAGNOSIS — R938 Abnormal findings on diagnostic imaging of other specified body structures: Secondary | ICD-10-CM

## 2015-04-07 DIAGNOSIS — R739 Hyperglycemia, unspecified: Secondary | ICD-10-CM

## 2015-04-07 DIAGNOSIS — R9389 Abnormal findings on diagnostic imaging of other specified body structures: Secondary | ICD-10-CM

## 2015-04-07 DIAGNOSIS — E7800 Pure hypercholesterolemia, unspecified: Secondary | ICD-10-CM | POA: Diagnosis not present

## 2015-04-07 DIAGNOSIS — R1310 Dysphagia, unspecified: Secondary | ICD-10-CM

## 2015-04-07 DIAGNOSIS — E038 Other specified hypothyroidism: Secondary | ICD-10-CM | POA: Diagnosis not present

## 2015-04-07 DIAGNOSIS — I10 Essential (primary) hypertension: Secondary | ICD-10-CM

## 2015-04-07 DIAGNOSIS — F329 Major depressive disorder, single episode, unspecified: Secondary | ICD-10-CM

## 2015-04-07 DIAGNOSIS — F331 Major depressive disorder, recurrent, moderate: Secondary | ICD-10-CM | POA: Diagnosis not present

## 2015-04-07 LAB — CBC WITH DIFFERENTIAL/PLATELET
BASOS ABS: 0 10*3/uL (ref 0.0–0.1)
BASOS PCT: 0.6 % (ref 0.0–3.0)
EOS ABS: 0.3 10*3/uL (ref 0.0–0.7)
Eosinophils Relative: 3.8 % (ref 0.0–5.0)
HCT: 39.6 % (ref 36.0–46.0)
Hemoglobin: 13.3 g/dL (ref 12.0–15.0)
LYMPHS ABS: 2.1 10*3/uL (ref 0.7–4.0)
Lymphocytes Relative: 30.1 % (ref 12.0–46.0)
MCHC: 33.5 g/dL (ref 30.0–36.0)
MCV: 87.8 fl (ref 78.0–100.0)
MONO ABS: 0.6 10*3/uL (ref 0.1–1.0)
Monocytes Relative: 8.7 % (ref 3.0–12.0)
NEUTROS ABS: 3.9 10*3/uL (ref 1.4–7.7)
NEUTROS PCT: 56.8 % (ref 43.0–77.0)
PLATELETS: 308 10*3/uL (ref 150.0–400.0)
RBC: 4.52 Mil/uL (ref 3.87–5.11)
RDW: 13.7 % (ref 11.5–15.5)
WBC: 6.9 10*3/uL (ref 4.0–10.5)

## 2015-04-07 LAB — T4, FREE: Free T4: 0.93 ng/dL (ref 0.60–1.60)

## 2015-04-07 LAB — BASIC METABOLIC PANEL
BUN: 11 mg/dL (ref 6–23)
CALCIUM: 9.9 mg/dL (ref 8.4–10.5)
CO2: 21 mEq/L (ref 19–32)
CREATININE: 0.79 mg/dL (ref 0.40–1.20)
Chloride: 101 mEq/L (ref 96–112)
GFR: 72.76 mL/min (ref >60.00)
GLUCOSE: 108 mg/dL — AB (ref 70–99)
Potassium: 4.5 mEq/L (ref 3.5–5.1)
Sodium: 137 mEq/L (ref 135–145)

## 2015-04-07 LAB — LIPID PANEL
CHOL/HDL RATIO: 3
Cholesterol: 211 mg/dL — ABNORMAL HIGH (ref 0–200)
HDL: 60.8 mg/dL (ref >39.00)
LDL Cholesterol: 115 mg/dL — ABNORMAL HIGH (ref 0–99)
NonHDL: 149.85
TRIGLYCERIDES: 173 mg/dL — AB (ref 0.0–149.0)
VLDL: 34.6 mg/dL (ref 0.0–40.0)

## 2015-04-07 LAB — HEMOGLOBIN A1C: Hgb A1c MFr Bld: 6 % (ref 4.6–6.5)

## 2015-04-07 LAB — HEPATIC FUNCTION PANEL
ALBUMIN: 4.3 g/dL (ref 3.5–5.2)
ALK PHOS: 113 U/L (ref 39–117)
ALT: 19 U/L (ref 0–35)
AST: 24 U/L (ref 0–37)
BILIRUBIN DIRECT: 0.1 mg/dL (ref 0.0–0.3)
TOTAL PROTEIN: 7.3 g/dL (ref 6.0–8.3)
Total Bilirubin: 0.5 mg/dL (ref 0.2–1.2)

## 2015-04-07 LAB — TSH: TSH: 0.17 u[IU]/mL — ABNORMAL LOW (ref 0.35–4.50)

## 2015-04-07 LAB — T3, FREE: T3 FREE: 3 pg/mL (ref 2.3–4.2)

## 2015-04-07 NOTE — Progress Notes (Signed)
Patient ID: Sarah [name], female   DOB: 1923/03/28, 82 y.o.   MRN: [id number]   Subjective:    Patient ID: Sarah [name], female    DOB: [birth year], 82 y.o.   MRN: [id number]  HPI  Patient here for a scheduled follow up.  Denies significant dysphagia now.  See last note for details.  Still reports notice of what feels like phlegm in her throat and occasional choking sensation.  She is eating.  No nausea or vomiting.  Bowels stable.  No abdominal pain or cramping.     Past Medical History  Diagnosis Date  . Arthritis   . Depression   . Glaucoma   . Hypercholesterolemia   . Subclinical hypothyroidism   . Hypertension    Past Surgical History  Procedure Laterality Date  . Abdominal hysterectomy  1978  . Hip fracture surgery Right    Family History  Problem Relation Age of Onset  . Alcohol abuse Mother   . Arthritis Mother   . Hyperlipidemia Mother   . Hypertension Mother   . Alcohol abuse Father   . Arthritis Father   . Hyperlipidemia Father   . Hypertension Father    Social History   Social History  . Marital Status: Widowed    Spouse Name: N/A  . Number of Children: N/A  . Years of Education: N/A   Social History Main Topics  . Smoking status: Never Smoker   . Smokeless tobacco: Never Used  . Alcohol Use: No  . Drug Use: No  . Sexual Activity: Not Currently   Other Topics Concern  . None   Social History Narrative    Outpatient Encounter Prescriptions as of 04/07/2015  Medication Sig  . ALPRAZolam (XANAX) 0.5 MG tablet Take 0.5 mg by mouth daily as needed.   Marland Kitchen amLODipine (NORVASC) 5 MG tablet take 1 tablet by mouth once daily  . fluticasone (FLONASE) 50 MCG/ACT nasal spray Place 2 sprays into both nostrils daily.  Marland Kitchen latanoprost (XALATAN) 0.005 % ophthalmic solution   . Multiple Vitamins-Minerals (CENTRUM SILVER PO) Take 1 tablet by mouth daily.  . timolol (TIMOPTIC-XR) 0.5 % ophthalmic gel-forming   . venlafaxine XR (EFFEXOR-XR) 150 MG 24  hr capsule Take three tablets by mouth daily   No facility-administered encounter medications on file as of 04/07/2015.    Review of Systems  Constitutional: Negative for fever and appetite change.  HENT: Negative for congestion and sinus pressure.   Respiratory: Positive for choking. Negative for cough, chest tightness and shortness of breath.   Cardiovascular: Negative for chest pain, palpitations and leg swelling.  Gastrointestinal: Negative for nausea, vomiting, abdominal pain and diarrhea.  Genitourinary: Negative for dysuria and difficulty urinating.  Musculoskeletal: Negative for back pain and joint swelling.  Skin: Negative for color change and rash.  Neurological: Negative for dizziness, light-headedness and headaches.  Psychiatric/Behavioral: Negative for dysphoric mood and agitation.       Objective:    Physical Exam  Constitutional: She appears well-developed and well-nourished. No distress.  HENT:  Nose: Nose normal.  Mouth/Throat: Oropharynx is clear and moist.  Neck: Neck supple. No thyromegaly present.  Cardiovascular: Normal rate and regular rhythm.   Pulmonary/Chest: Breath sounds normal. No respiratory distress. She has no wheezes.  Abdominal: Soft. Bowel sounds are normal. There is no tenderness.  Musculoskeletal: She exhibits no edema or tenderness.  Lymphadenopathy:    She has no cervical adenopathy.  Skin: No rash noted. No erythema.  Psychiatric: She has a normal  mood and affect. Her behavior is normal.    BP 132/70 mmHg  Pulse 83  Temp(Src) 98 F (36.7 C) (Oral)  Resp 18  Ht _0  (1.549 m)  Wt 136 lb 8 oz (61.916 kg)  BMI 25.80 kg/m2  SpO2 96% Wt Readings from Last 3 Encounters:  04/07/15 136 lb 8 oz (61.916 kg)  09/23/14 140 lb 8 oz (63.73 kg)  09/04/14 134 lb 12.8 oz (61.145 kg)     Lab Results  Component Value Date   WBC 6.9 04/07/2015   HGB 13.3 04/07/2015   HCT 39.6 04/07/2015   PLT 308.0 04/07/2015   GLUCOSE 108* 04/07/2015    CHOL 211* 04/07/2015   TRIG 173.0* 04/07/2015   HDL 60.80 04/07/2015   LDLDIRECT 141.7 08/23/2013   LDLCALC 115* 04/07/2015   ALT 19 04/07/2015   AST 24 04/07/2015   NA 137 04/07/2015   K 4.5 04/07/2015   CL 101 04/07/2015   CREATININE 0.79 04/07/2015   BUN 11 04/07/2015   CO2 21 04/07/2015   TSH 0.17* 04/07/2015   INR 1.0 05/05/2012   HGBA1C 6.0 04/07/2015    Dg Chest 2 View  09/01/2014  CLINICAL DATA:  Epigastric pain and nausea. EXAM: CHEST  2 VIEW COMPARISON:  05/27/2014 FINDINGS: Rightward tracheal deviation again noted without significant change, likely due to thyroid goiter. The lungs are clear. There are no effusions. Pulmonary vasculature is normal. Heart size is normal. IMPRESSION: Unchanged rightward deviation of the trachea, likely due to a goiter. No acute cardiopulmonary findings. Electronically Signed   By: Andreas Newport M.D.   On: 09/01/2014 02:20       Assessment & Plan:   Problem List Items Addressed This Visit    Abnormal chest CT    CT reviewed.  She declines f/u scanning and further w/up.  Discussed with her again today.  She continues to decline.        Depression    Sees Dr Clovis Riley.  Stable.        Dysphagia    CXR as outlined.  See report.  CT reviewed.  She is eating.  Discussed the need for further w/up.  Discussed f/u CT, etc.  She declines any further w/up.  Follow.  Will notify me if she changes her mind.        Hypercholesterolemia    Low cholesterol diet and exercise.  Follow lipid panel.        Relevant Orders   Lipid panel (Completed)   Hepatic function panel (Completed)   Hyperglycemia    Follow met b and a1c.       Relevant Orders   Hemoglobin A1c (Completed)   Hypertension - Primary    Blood pressure under good control.  Continue same medication regimen.  Follow pressures.  Follow metabolic panel.        Relevant Orders   CBC with Differential/Platelet (Completed)   Basic metabolic panel (Completed)   Subclinical  hypothyroidism    Follow thyroid function tests.        Relevant Orders   T3, free (Completed)   T4, free (Completed)   TSH (Completed)       Einar Pheasant, MD

## 2015-04-07 NOTE — Patient Instructions (Signed)
You can take robitussin twice a day as needed for the congestion

## 2015-04-07 NOTE — Progress Notes (Signed)
Pre-visit discussion using our clinic review tool. No additional management support is needed unless otherwise documented below in the visit note.  

## 2015-04-08 ENCOUNTER — Encounter: Payer: Self-pay | Admitting: *Deleted

## 2015-04-20 ENCOUNTER — Encounter: Payer: Self-pay | Admitting: Internal Medicine

## 2015-04-20 NOTE — Assessment & Plan Note (Signed)
Follow met b and a1c.  

## 2015-04-20 NOTE — Assessment & Plan Note (Signed)
CXR as outlined.  See report.  CT reviewed.  She is eating.  Discussed the need for further w/up.  Discussed f/u CT, etc.  She declines any further w/up.  Follow.  Will notify me if she changes her mind.

## 2015-04-20 NOTE — Assessment & Plan Note (Signed)
Follow thyroid function tests.   

## 2015-04-20 NOTE — Assessment & Plan Note (Signed)
Low cholesterol diet and exercise.  Follow lipid panel.   

## 2015-04-20 NOTE — Assessment & Plan Note (Signed)
Sees Dr Levine.  Stable.   

## 2015-04-20 NOTE — Assessment & Plan Note (Signed)
CT reviewed.  She declines f/u scanning and further w/up.  Discussed with her again today.  She continues to decline.

## 2015-04-20 NOTE — Assessment & Plan Note (Signed)
Blood pressure under good control.  Continue same medication regimen.  Follow pressures.  Follow metabolic panel.   

## 2015-07-02 DIAGNOSIS — F331 Major depressive disorder, recurrent, moderate: Secondary | ICD-10-CM | POA: Diagnosis not present

## 2015-07-08 ENCOUNTER — Ambulatory Visit (INDEPENDENT_AMBULATORY_CARE_PROVIDER_SITE_OTHER): Payer: Medicare Other | Admitting: Internal Medicine

## 2015-07-08 ENCOUNTER — Encounter: Payer: Self-pay | Admitting: Internal Medicine

## 2015-07-08 VITALS — BP 118/80 | HR 94 | Temp 98.4°F | Resp 17 | Ht 61.0 in | Wt 133.1 lb

## 2015-07-08 DIAGNOSIS — E038 Other specified hypothyroidism: Secondary | ICD-10-CM

## 2015-07-08 DIAGNOSIS — E039 Hypothyroidism, unspecified: Secondary | ICD-10-CM

## 2015-07-08 DIAGNOSIS — R9389 Abnormal findings on diagnostic imaging of other specified body structures: Secondary | ICD-10-CM

## 2015-07-08 DIAGNOSIS — F329 Major depressive disorder, single episode, unspecified: Secondary | ICD-10-CM

## 2015-07-08 DIAGNOSIS — R1310 Dysphagia, unspecified: Secondary | ICD-10-CM

## 2015-07-08 DIAGNOSIS — I10 Essential (primary) hypertension: Secondary | ICD-10-CM

## 2015-07-08 DIAGNOSIS — R739 Hyperglycemia, unspecified: Secondary | ICD-10-CM | POA: Diagnosis not present

## 2015-07-08 DIAGNOSIS — R938 Abnormal findings on diagnostic imaging of other specified body structures: Secondary | ICD-10-CM

## 2015-07-08 DIAGNOSIS — E7800 Pure hypercholesterolemia, unspecified: Secondary | ICD-10-CM

## 2015-07-08 DIAGNOSIS — F32A Depression, unspecified: Secondary | ICD-10-CM

## 2015-07-08 LAB — TSH: TSH: 0.08 u[IU]/mL — ABNORMAL LOW (ref 0.35–4.50)

## 2015-07-08 LAB — BASIC METABOLIC PANEL
BUN: 10 mg/dL (ref 6–23)
CO2: 27 mEq/L (ref 19–32)
CREATININE: 0.81 mg/dL (ref 0.40–1.20)
Calcium: 9.3 mg/dL (ref 8.4–10.5)
Chloride: 102 mEq/L (ref 96–112)
GFR: 70.65 mL/min (ref >60.00)
Glucose, Bld: 163 mg/dL — ABNORMAL HIGH (ref 70–99)
POTASSIUM: 4.4 meq/L (ref 3.5–5.1)
Sodium: 138 mEq/L (ref 135–145)

## 2015-07-08 LAB — HEMOGLOBIN A1C: HEMOGLOBIN A1C: 5.7 % (ref 4.6–6.5)

## 2015-07-08 LAB — T3, FREE: T3, Free: 2.9 pg/mL (ref 2.3–4.2)

## 2015-07-08 LAB — T4, FREE: Free T4: 0.94 ng/dL (ref 0.60–1.60)

## 2015-07-08 NOTE — Assessment & Plan Note (Signed)
Followed thyroid function tests.  tsh suppressed.  Free t4 and free t3 wnl.  Recheck today.

## 2015-07-08 NOTE — Assessment & Plan Note (Signed)
Discussed CT with pt.  She declines further w/up and testing.  She continues to decline.

## 2015-07-08 NOTE — Assessment & Plan Note (Signed)
Seeing Dr Levine.  Stable.   

## 2015-07-08 NOTE — Assessment & Plan Note (Signed)
Blood pressure under good control.  Continue same medication regimen.  Follow pressures.  Follow metabolic panel.   

## 2015-07-08 NOTE — Progress Notes (Signed)
Pre-visit discussion using our clinic review tool. No additional management support is needed unless otherwise documented below in the visit note.  

## 2015-07-08 NOTE — Progress Notes (Signed)
Patient ID: Sarah [name], female   DOB: 05-24-1923, 82 y.o.   MRN: [id number]   Subjective:    Patient ID: Sarah [name], female    DOB: 03-27-1923, 82 y.o.   MRN: [id number]  HPI  Patient here for a scheduled follow up.  States she is doing well.  Has lost weight.  States she has adjusted her diet.  Has cut out sugar. Feels this is the reason she has lost weight.  Still some swallowing issues at times, but feels is better.  No chest pain.  No sob.  No cough or congestion.  No acid reflux.  No abdominal pain or cramping.  Bowels stable.  Handling stress.  Seeing Dr Clovis Riley.  Just evaluated last week.     Past Medical History  Diagnosis Date  . Arthritis   . Depression   . Glaucoma   . Hypercholesterolemia   . Subclinical hypothyroidism   . Hypertension    Past Surgical History  Procedure Laterality Date  . Abdominal hysterectomy  1978  . Hip fracture surgery Right    Family History  Problem Relation Age of Onset  . Alcohol abuse Mother   . Arthritis Mother   . Hyperlipidemia Mother   . Hypertension Mother   . Alcohol abuse Father   . Arthritis Father   . Hyperlipidemia Father   . Hypertension Father    Social History   Social History  . Marital Status: Widowed    Spouse Name: N/A  . Number of Children: N/A  . Years of Education: N/A   Social History Main Topics  . Smoking status: Never Smoker   . Smokeless tobacco: Never Used  . Alcohol Use: No  . Drug Use: No  . Sexual Activity: Not Currently   Other Topics Concern  . None   Social History Narrative    Outpatient Encounter Prescriptions as of 07/08/2015  Medication Sig  . ALPRAZolam (XANAX) 0.5 MG tablet Take 0.5 mg by mouth daily as needed.   Marland Kitchen amLODipine (NORVASC) 5 MG tablet take 1 tablet by mouth once daily  . fluticasone (FLONASE) 50 MCG/ACT nasal spray Place 2 sprays into both nostrils daily.  Marland Kitchen latanoprost (XALATAN) 0.005 % ophthalmic solution   . Multiple Vitamins-Minerals (CENTRUM  SILVER PO) Take 1 tablet by mouth daily.  . timolol (TIMOPTIC-XR) 0.5 % ophthalmic gel-forming   . venlafaxine XR (EFFEXOR-XR) 150 MG 24 hr capsule Take three tablets by mouth daily   No facility-administered encounter medications on file as of 07/08/2015.    Review of Systems  Constitutional: Negative for appetite change.       Weight loss - she reports is due to diet adjustment.    HENT: Negative for congestion and sinus pressure.   Respiratory: Negative for cough, chest tightness and shortness of breath.   Cardiovascular: Negative for chest pain, palpitations and leg swelling.  Gastrointestinal: Negative for nausea, vomiting, abdominal pain and diarrhea.  Genitourinary: Negative for dysuria and difficulty urinating.  Musculoskeletal: Negative for back pain and joint swelling.  Skin: Negative for color change and rash.  Neurological: Negative for dizziness, light-headedness and headaches.  Psychiatric/Behavioral: Negative for dysphoric mood and agitation.       Objective:     Blood pressure rechecked by me:  130/78  Physical Exam  Constitutional: She appears well-developed and well-nourished. No distress.  HENT:  Nose: Nose normal.  Mouth/Throat: Oropharynx is clear and moist.  Neck: Neck supple. No thyromegaly present.  Cardiovascular: Normal rate and regular  rhythm.   Pulmonary/Chest: Breath sounds normal. No respiratory distress. She has no wheezes.  Abdominal: Soft. Bowel sounds are normal. There is no tenderness.  Musculoskeletal: She exhibits no edema or tenderness.  Lymphadenopathy:    She has no cervical adenopathy.  Skin: No rash noted. No erythema.  Psychiatric: She has a normal mood and affect. Her behavior is normal.    BP 118/80 mmHg  Pulse 94  Temp(Src) 98.4 F (36.9 C) (Oral)  Resp 17  Ht _0  (1.549 m)  Wt 133 lb 2 oz (60.385 kg)  BMI 25.17 kg/m2  SpO2 95% Wt Readings from Last 3 Encounters:  07/08/15 133 lb 2 oz (60.385 kg)  04/07/15 136 lb 8 oz  (61.916 kg)  09/23/14 140 lb 8 oz (63.73 kg)     Lab Results  Component Value Date   WBC 6.9 04/07/2015   HGB 13.3 04/07/2015   HCT 39.6 04/07/2015   PLT 308.0 04/07/2015   GLUCOSE 108* 04/07/2015   CHOL 211* 04/07/2015   TRIG 173.0* 04/07/2015   HDL 60.80 04/07/2015   LDLDIRECT 141.7 08/23/2013   LDLCALC 115* 04/07/2015   ALT 19 04/07/2015   AST 24 04/07/2015   NA 137 04/07/2015   K 4.5 04/07/2015   CL 101 04/07/2015   CREATININE 0.79 04/07/2015   BUN 11 04/07/2015   CO2 21 04/07/2015   TSH 0.17* 04/07/2015   INR 1.0 05/05/2012   HGBA1C 6.0 04/07/2015    Dg Chest 2 View  09/01/2014  CLINICAL DATA:  Epigastric pain and nausea. EXAM: CHEST  2 VIEW COMPARISON:  05/27/2014 FINDINGS: Rightward tracheal deviation again noted without significant change, likely due to thyroid goiter. The lungs are clear. There are no effusions. Pulmonary vasculature is normal. Heart size is normal. IMPRESSION: Unchanged rightward deviation of the trachea, likely due to a goiter. No acute cardiopulmonary findings. Electronically Signed   By: Andreas Newport M.D.   On: 09/01/2014 02:20       Assessment & Plan:   Problem List Items Addressed This Visit    Abnormal chest CT    Discussed CT with pt.  She declines further w/up and testing.  She continues to decline.        Depression    Seeing Dr Clovis Riley.  Stable.        Dysphagia    CXR as outlined.  CT reviewed.  She is eating.  Discussed with her today regarding further w/up.  She continues to decline.  Will notify me if she changes her mind.        Hypercholesterolemia    Last cholesterol improved.  Follow.       Hyperglycemia    Has adjusted her diet.  Cut out sugars.  Lost weight.  Check met b and a1c.       Relevant Orders   Hemoglobin A1c   Hypertension - Primary    Blood pressure under good control.  Continue same medication regimen.  Follow pressures.  Follow metabolic panel.        Relevant Orders   Basic metabolic panel    Subclinical hypothyroidism    Followed thyroid function tests.  tsh suppressed.  Free t4 and free t3 wnl.  Recheck today.       Relevant Orders   TSH   T3, free   T4, free       Mattix Imhof, Randell Patient, MD

## 2015-07-08 NOTE — Assessment & Plan Note (Signed)
Has adjusted her diet.  Cut out sugars.  Lost weight.  Check met b and a1c.

## 2015-07-08 NOTE — Assessment & Plan Note (Signed)
CXR as outlined.  CT reviewed.  She is eating.  Discussed with her today regarding further w/up.  She continues to decline.  Will notify me if she changes her mind.

## 2015-07-08 NOTE — Assessment & Plan Note (Signed)
Last cholesterol improved.  Follow.

## 2015-07-09 ENCOUNTER — Encounter: Payer: Self-pay | Admitting: *Deleted

## 2015-07-29 ENCOUNTER — Other Ambulatory Visit: Payer: Self-pay | Admitting: Internal Medicine

## 2015-08-19 DIAGNOSIS — H353132 Nonexudative age-related macular degeneration, bilateral, intermediate dry stage: Secondary | ICD-10-CM | POA: Diagnosis not present

## 2015-10-09 ENCOUNTER — Encounter (INDEPENDENT_AMBULATORY_CARE_PROVIDER_SITE_OTHER): Payer: Self-pay

## 2015-10-09 ENCOUNTER — Telehealth: Payer: Self-pay | Admitting: *Deleted

## 2015-10-09 ENCOUNTER — Ambulatory Visit (INDEPENDENT_AMBULATORY_CARE_PROVIDER_SITE_OTHER): Payer: Medicare Other | Admitting: Internal Medicine

## 2015-10-09 ENCOUNTER — Encounter: Payer: Self-pay | Admitting: Internal Medicine

## 2015-10-09 VITALS — BP 142/64 | HR 97 | Temp 98.3°F | Ht 61.0 in | Wt 135.2 lb

## 2015-10-09 DIAGNOSIS — R739 Hyperglycemia, unspecified: Secondary | ICD-10-CM

## 2015-10-09 DIAGNOSIS — I10 Essential (primary) hypertension: Secondary | ICD-10-CM | POA: Diagnosis not present

## 2015-10-09 DIAGNOSIS — E038 Other specified hypothyroidism: Secondary | ICD-10-CM | POA: Diagnosis not present

## 2015-10-09 DIAGNOSIS — R9389 Abnormal findings on diagnostic imaging of other specified body structures: Secondary | ICD-10-CM

## 2015-10-09 DIAGNOSIS — F32A Depression, unspecified: Secondary | ICD-10-CM

## 2015-10-09 DIAGNOSIS — E039 Hypothyroidism, unspecified: Secondary | ICD-10-CM

## 2015-10-09 DIAGNOSIS — R938 Abnormal findings on diagnostic imaging of other specified body structures: Secondary | ICD-10-CM

## 2015-10-09 DIAGNOSIS — E7800 Pure hypercholesterolemia, unspecified: Secondary | ICD-10-CM

## 2015-10-09 DIAGNOSIS — R1310 Dysphagia, unspecified: Secondary | ICD-10-CM

## 2015-10-09 DIAGNOSIS — F329 Major depressive disorder, single episode, unspecified: Secondary | ICD-10-CM

## 2015-10-09 LAB — T4, FREE: Free T4: 0.94 ng/dL (ref 0.60–1.60)

## 2015-10-09 LAB — HEPATIC FUNCTION PANEL
ALT: 16 U/L (ref 0–35)
AST: 17 U/L (ref 0–37)
Albumin: 4 g/dL (ref 3.5–5.2)
Alkaline Phosphatase: 123 U/L — ABNORMAL HIGH (ref 39–117)
BILIRUBIN TOTAL: 0.4 mg/dL (ref 0.2–1.2)
Bilirubin, Direct: 0.1 mg/dL (ref 0.0–0.3)
Total Protein: 7.1 g/dL (ref 6.0–8.3)

## 2015-10-09 LAB — BASIC METABOLIC PANEL
BUN: 13 mg/dL (ref 6–23)
CO2: 30 meq/L (ref 19–32)
Calcium: 9.1 mg/dL (ref 8.4–10.5)
Chloride: 101 mEq/L (ref 96–112)
Creatinine, Ser: 0.74 mg/dL (ref 0.40–1.20)
GFR: 78.37 mL/min (ref >60.00)
GLUCOSE: 130 mg/dL — AB (ref 70–99)
POTASSIUM: 4.2 meq/L (ref 3.5–5.1)
SODIUM: 136 meq/L (ref 135–145)

## 2015-10-09 LAB — T3, FREE: T3 FREE: 3.1 pg/mL (ref 2.3–4.2)

## 2015-10-09 LAB — HEMOGLOBIN A1C: Hgb A1c MFr Bld: 5.8 % (ref 4.6–6.5)

## 2015-10-09 LAB — TSH: TSH: 0.1 u[IU]/mL — AB (ref 0.35–4.50)

## 2015-10-09 NOTE — Progress Notes (Signed)
Patient ID: Sarah [name], female   DOB: 28-Mar-1923, 82 y.o.   MRN: [id number]   Subjective:    Patient ID: Sarah [name], female    DOB: [birth year], 82 y.o.   MRN: [id number]  HPI  Patient here for a scheduled follow up.  States she is doing well.  Feels good.  Eating.  Has someone who stays with her.  No chest pain.  No sob.  States she has no problems swallowing now.  No abdominal pain or cramping.  Bowels stable.  No urine change.     Past Medical History:  Diagnosis Date  . Arthritis   . Depression   . Glaucoma   . Hypercholesterolemia   . Hypertension   . Subclinical hypothyroidism    Past Surgical History:  Procedure Laterality Date  . ABDOMINAL HYSTERECTOMY  1978  . HIP FRACTURE SURGERY Right    Family History  Problem Relation Age of Onset  . Alcohol abuse Mother   . Arthritis Mother   . Hyperlipidemia Mother   . Hypertension Mother   . Alcohol abuse Father   . Arthritis Father   . Hyperlipidemia Father   . Hypertension Father    Social History   Social History  . Marital status: Widowed    Spouse name: N/A  . Number of children: N/A  . Years of education: N/A   Social History Main Topics  . Smoking status: Never Smoker  . Smokeless tobacco: Never Used  . Alcohol use No  . Drug use: No  . Sexual activity: Not Currently   Other Topics Concern  . None   Social History Narrative  . None    Outpatient Encounter Prescriptions as of 10/09/2015  Medication Sig  . ALPRAZolam (XANAX) 0.5 MG tablet Take 0.5 mg by mouth daily as needed.   Marland Kitchen amLODipine (NORVASC) 5 MG tablet take 1 tablet by mouth once daily  . fluticasone (FLONASE) 50 MCG/ACT nasal spray Place 2 sprays into both nostrils daily.  Marland Kitchen latanoprost (XALATAN) 0.005 % ophthalmic solution   . Multiple Vitamins-Minerals (CENTRUM SILVER PO) Take 1 tablet by mouth daily.  . timolol (TIMOPTIC-XR) 0.5 % ophthalmic gel-forming   . venlafaxine XR (EFFEXOR-XR) 150 MG 24 hr capsule Take three  tablets by mouth daily   No facility-administered encounter medications on file as of 10/09/2015.     Review of Systems  Constitutional: Negative for appetite change and unexpected weight change.  HENT: Negative for congestion and sinus pressure.   Respiratory: Negative for cough, chest tightness and shortness of breath.   Cardiovascular: Negative for chest pain, palpitations and leg swelling.  Gastrointestinal: Negative for abdominal pain, diarrhea, nausea and vomiting.  Genitourinary: Negative for difficulty urinating and dysuria.  Musculoskeletal: Negative for back pain and joint swelling.  Skin: Negative for color change and rash.  Neurological: Negative for dizziness and headaches.  Psychiatric/Behavioral: Negative for agitation and dysphoric mood.       Objective:     Blood pressure rechecked by me:  136/82  Physical Exam  Constitutional: She appears well-developed and well-nourished. No distress.  HENT:  Nose: Nose normal.  Mouth/Throat: Oropharynx is clear and moist.  Neck: Neck supple. No thyromegaly present.  Cardiovascular: Normal rate and regular rhythm.   Pulmonary/Chest: Breath sounds normal. No respiratory distress. She has no wheezes.  Abdominal: Soft. Bowel sounds are normal. There is no tenderness.  Musculoskeletal: She exhibits no edema or tenderness.  Lymphadenopathy:    She has no cervical adenopathy.  Skin:  No rash noted. No erythema.  Psychiatric: She has a normal mood and affect. Her behavior is normal.    BP (!) 142/64   Pulse 97   Temp 98.3 F (36.8 C) (Oral)   Ht _0  (1.549 m)   Wt 135 lb 3.2 oz (61.3 kg)   SpO2 96%   BMI 25.55 kg/m  Wt Readings from Last 3 Encounters:  10/09/15 135 lb 3.2 oz (61.3 kg)  07/08/15 133 lb 2 oz (60.4 kg)  04/07/15 136 lb 8 oz (61.9 kg)     Lab Results  Component Value Date   WBC 6.9 04/07/2015   HGB 13.3 04/07/2015   HCT 39.6 04/07/2015   PLT 308.0 04/07/2015   GLUCOSE 130 (H) 10/09/2015   CHOL 211  (H) 04/07/2015   TRIG 173.0 (H) 04/07/2015   HDL 60.80 04/07/2015   LDLDIRECT 141.7 08/23/2013   LDLCALC 115 (H) 04/07/2015   ALT 16 10/09/2015   AST 17 10/09/2015   NA 136 10/09/2015   K 4.2 10/09/2015   CL 101 10/09/2015   CREATININE 0.74 10/09/2015   BUN 13 10/09/2015   CO2 30 10/09/2015   TSH 0.10 (L) 10/09/2015   INR 1.0 05/05/2012   HGBA1C 5.8 10/09/2015    Dg Chest 2 View  Result Date: 09/01/2014 CLINICAL DATA:  Epigastric pain and nausea. EXAM: CHEST  2 VIEW COMPARISON:  05/27/2014 FINDINGS: Rightward tracheal deviation again noted without significant change, likely due to thyroid goiter. The lungs are clear. There are no effusions. Pulmonary vasculature is normal. Heart size is normal. IMPRESSION: Unchanged rightward deviation of the trachea, likely due to a goiter. No acute cardiopulmonary findings. Electronically Signed   By: Andreas Newport M.D.   On: 09/01/2014 02:20       Assessment & Plan:   Problem List Items Addressed This Visit    Abnormal chest CT    Discussed again with her.  She declines further w/up and repeat scanning.        Depression    Followed by Dr Clovis Riley.  Stable.       Dysphagia    Previous CXR and CT scan as outlined.  Discussed with her again today regarding further w/up and repeat scanning.  She declines.  Reports no swallowing problems now.  Follow.  Will notify me if she changes her mind.        Hypercholesterolemia    Low cholesterol diet and exercise.  Follow lipid panel.        Relevant Orders   Hepatic function panel (Completed)   Hyperglycemia    Follow met b and a1c.       Relevant Orders   Hemoglobin A1c (Completed)   Hypertension    Blood pressure under good control.  Continue same medication regimen.  Follow pressures.  Follow metabolic panel.        Relevant Orders   Basic metabolic panel (Completed)   Subclinical hypothyroidism - Primary    tsh has been suppressed.  Free T3 and free T4 wnl.  Recheck thyroid  function tests today.        Relevant Orders   TSH (Completed)   T4, free (Completed)   T3, free (Completed)    Other Visit Diagnoses   None.      Sarah Pheasant, MD

## 2015-10-09 NOTE — Telephone Encounter (Signed)
Pt will need a 4 month follow up

## 2015-10-09 NOTE — Progress Notes (Signed)
Pre visit review using our clinic review tool, if applicable. No additional management support is needed unless otherwise documented below in the visit note. 

## 2015-10-13 ENCOUNTER — Encounter: Payer: Self-pay | Admitting: Internal Medicine

## 2015-10-13 NOTE — Assessment & Plan Note (Signed)
Followed by Dr Lenis NoonLevine.  Stable.

## 2015-10-13 NOTE — Assessment & Plan Note (Signed)
Follow met b and a1c.  

## 2015-10-13 NOTE — Assessment & Plan Note (Signed)
Previous CXR and CT scan as outlined.  Discussed with her again today regarding further w/up and repeat scanning.  She declines.  Reports no swallowing problems now.  Follow.  Will notify me if she changes her mind.

## 2015-10-13 NOTE — Assessment & Plan Note (Signed)
Low cholesterol diet and exercise.  Follow lipid panel.   

## 2015-10-13 NOTE — Assessment & Plan Note (Signed)
tsh has been suppressed.  Free T3 and free T4 wnl.  Recheck thyroid function tests today.

## 2015-10-13 NOTE — Assessment & Plan Note (Signed)
Discussed again with her.  She declines further w/up and repeat scanning.

## 2015-10-13 NOTE — Assessment & Plan Note (Signed)
Blood pressure under good control.  Continue same medication regimen.  Follow pressures.  Follow metabolic panel.   

## 2015-11-13 DIAGNOSIS — F331 Major depressive disorder, recurrent, moderate: Secondary | ICD-10-CM | POA: Diagnosis not present

## 2015-11-20 ENCOUNTER — Encounter (HOSPITAL_COMMUNITY): Payer: Self-pay

## 2015-11-20 ENCOUNTER — Encounter: Payer: Self-pay | Admitting: Emergency Medicine

## 2015-11-20 ENCOUNTER — Telehealth: Payer: Self-pay | Admitting: Internal Medicine

## 2015-11-20 ENCOUNTER — Emergency Department (HOSPITAL_COMMUNITY)
Admission: EM | Admit: 2015-11-20 | Discharge: 2015-11-20 | Disposition: A | Discharge status: Home / Self Care | Payer: Medicare Other | Attending: Emergency Medicine | Admitting: Emergency Medicine

## 2015-11-20 ENCOUNTER — Emergency Department (HOSPITAL_COMMUNITY): Payer: Medicare Other

## 2015-11-20 ENCOUNTER — Emergency Department
Admission: EM | Admit: 2015-11-20 | Discharge: 2015-11-20 | Disposition: A | Discharge status: Home / Self Care | Payer: Medicare Other | Attending: Emergency Medicine | Admitting: Emergency Medicine

## 2015-11-20 DIAGNOSIS — R1310 Dysphagia, unspecified: Secondary | ICD-10-CM | POA: Diagnosis present

## 2015-11-20 DIAGNOSIS — I10 Essential (primary) hypertension: Secondary | ICD-10-CM | POA: Insufficient documentation

## 2015-11-20 DIAGNOSIS — Z79899 Other long term (current) drug therapy: Secondary | ICD-10-CM | POA: Insufficient documentation

## 2015-11-20 DIAGNOSIS — Z5321 Procedure and treatment not carried out due to patient leaving prior to being seen by health care provider: Secondary | ICD-10-CM | POA: Diagnosis not present

## 2015-11-20 DIAGNOSIS — R1110 Vomiting, unspecified: Secondary | ICD-10-CM | POA: Diagnosis not present

## 2015-11-20 LAB — COMPREHENSIVE METABOLIC PANEL
ALT: 22 U/L (ref 14–54)
AST: 31 U/L (ref 15–41)
Albumin: 4.7 g/dL (ref 3.5–5.0)
Alkaline Phosphatase: 129 U/L — ABNORMAL HIGH (ref 38–126)
Anion gap: 13 (ref 5–15)
BILIRUBIN TOTAL: 0.6 mg/dL (ref 0.3–1.2)
BUN: 15 mg/dL (ref 6–20)
CHLORIDE: 100 mmol/L — AB (ref 101–111)
CO2: 24 mmol/L (ref 22–32)
CREATININE: 0.94 mg/dL (ref 0.44–1.00)
Calcium: 9.9 mg/dL (ref 8.9–10.3)
GFR, EST NON AFRICAN AMERICAN: 52 mL/min — AB (ref >60)
Glucose, Bld: 144 mg/dL — ABNORMAL HIGH (ref 65–99)
POTASSIUM: 3.8 mmol/L (ref 3.5–5.1)
Sodium: 137 mmol/L (ref 135–145)
TOTAL PROTEIN: 8.4 g/dL — AB (ref 6.5–8.1)

## 2015-11-20 LAB — CBC WITH DIFFERENTIAL/PLATELET
BASOS ABS: 0 10*3/uL (ref 0.0–0.1)
BASOS PCT: 1 %
EOS PCT: 0 %
Eosinophils Absolute: 0 10*3/uL (ref 0.0–0.7)
HCT: 43.4 % (ref 36.0–46.0)
Hemoglobin: 14.5 g/dL (ref 12.0–15.0)
LYMPHS PCT: 22 %
Lymphs Abs: 1.9 10*3/uL (ref 0.7–4.0)
MCH: 29.5 pg (ref 26.0–34.0)
MCHC: 33.4 g/dL (ref 30.0–36.0)
MCV: 88.4 fL (ref 78.0–100.0)
Monocytes Absolute: 0.7 10*3/uL (ref 0.1–1.0)
Monocytes Relative: 8 %
NEUTROS ABS: 6 10*3/uL (ref 1.7–7.7)
Neutrophils Relative %: 69 %
PLATELETS: 341 10*3/uL (ref 150–400)
RBC: 4.91 MIL/uL (ref 3.87–5.11)
RDW: 12.8 % (ref 11.5–15.5)
WBC: 8.7 10*3/uL (ref 4.0–10.5)

## 2015-11-20 LAB — URINE MICROSCOPIC-ADD ON

## 2015-11-20 LAB — URINALYSIS, ROUTINE W REFLEX MICROSCOPIC
Bilirubin Urine: NEGATIVE
GLUCOSE, UA: NEGATIVE mg/dL
HGB URINE DIPSTICK: NEGATIVE
KETONES UR: 15 mg/dL — AB
Nitrite: NEGATIVE
PROTEIN: 30 mg/dL — AB
Specific Gravity, Urine: 1.021 (ref 1.005–1.030)
pH: 6.5 (ref 5.0–8.0)

## 2015-11-20 LAB — LIPASE, BLOOD: LIPASE: 24 U/L (ref 11–51)

## 2015-11-20 MED ORDER — SODIUM CHLORIDE 0.9 % IV BOLUS (SEPSIS)
500.0000 mL | Freq: Once | INTRAVENOUS | Status: AC
  Administered 2015-11-20: 500 mL via INTRAVENOUS

## 2015-11-20 MED ORDER — ONDANSETRON 4 MG PO TBDP
4.0000 mg | ORAL_TABLET | Freq: Once | ORAL | Status: AC
  Administered 2015-11-20: 4 mg via ORAL
  Filled 2015-11-20: qty 1

## 2015-11-20 MED ORDER — ONDANSETRON 4 MG PO TBDP
4.0000 mg | ORAL_TABLET | Freq: Three times a day (TID) | ORAL | 0 refills | Status: AC | PRN
Start: 2015-11-20 — End: ?

## 2015-11-20 NOTE — ED Triage Notes (Signed)
C/O having difficulty swallowing since yesterday at lunch after eating barbeque.  States unable to swallow anything PO.  States had similar symptoms 3-4 years ago, but symptoms resolved.  Caregiver states PCP wants patient to have an endoscopy..  Denies nausea or abdominal pain.

## 2015-11-20 NOTE — Telephone Encounter (Signed)
Pt aide called and stated that pt has been vomiting clear since yesterday and unable to swallow anything. Sent call to [facility] Triage.

## 2015-11-20 NOTE — Telephone Encounter (Signed)
Awaiting [facility] note. thanks 

## 2015-11-20 NOTE — ED Triage Notes (Addendum)
Patient here with everything coming up as soon as she eats or drinks since eating barbecue yesterday, denies abdominal pain, no nausea, no diarrhea. No trouble handling secretions, NAD

## 2015-11-20 NOTE — Telephone Encounter (Signed)
Attempted to reach, is in the ED currently, will follow after if needed. thanks

## 2015-11-20 NOTE — Telephone Encounter (Signed)
Ok.  Let me know if needs anything.

## 2015-11-20 NOTE — ED Provider Notes (Signed)
[facility]-EMERGENCY DEPT Provider Note   CSN: [id number] Arrival date & time: 11/20/15  1425     History   Chief Complaint Chief Complaint  Patient presents with  . Emesis    HPI Sarah [name] is a 82 y.o. female.  HPI  82 year old female presents with vomiting since yesterday. She had barbecue for lunch yesterday with her son and then around 30 minutes later while eating ice cream she all of a sudden started vomiting. Since then has vomited only when trying to intake water or food. She feels hungry but nothing will stay down. The vomitus looks like whatever she just ate or drank. No blood. No diarrhea, has chronic constipation that is not worse than typical. No abdominal pain or distention. No chest pain or fevers. No urinary symptoms. She does not currently feel nauseated. No trouble swallowing at baseline.  Past Medical History:  Diagnosis Date  . Arthritis   . Depression   . Glaucoma   . Hypercholesterolemia   . Hypertension   . Subclinical hypothyroidism     Patient Active Problem List   Diagnosis Date Noted  . Dysphagia 09/07/2014  . URI (upper respiratory infection) 05/26/2014  . Hallucinations 05/26/2014  . Abnormal chest CT 07/24/2012  . Hypertension 11/03/2011  . Hyperglycemia 11/03/2011  . Subclinical hypothyroidism 11/03/2011  . Hypercholesterolemia 11/03/2011  . Depression 11/03/2011    Past Surgical History:  Procedure Laterality Date  . ABDOMINAL HYSTERECTOMY  1978  . HIP FRACTURE SURGERY Right     OB History    No data available       Home Medications    Prior to Admission medications   Medication Sig Start Date End Date Taking? Authorizing Provider  ALPRAZolam Prudy Feeler(XANAX) 0.5 MG tablet Take 0.25-0.5 mg by mouth daily as needed for anxiety.  04/30/14  Yes Historical Provider, MD  amLODipine (NORVASC) 5 MG tablet take 1 tablet by mouth once daily 07/30/15  Yes Dale Durhamharlene Cienna Dumais, MD  latanoprost (XALATAN) 0.005 % ophthalmic solution  05/22/14  Yes  Historical Provider, MD  Multiple Vitamins-Minerals (CENTRUM SILVER PO) Take 1 tablet by mouth daily.   Yes Historical Provider, MD  timolol (TIMOPTIC-XR) 0.5 % ophthalmic gel-forming  05/09/14  Yes Historical Provider, MD  venlafaxine XR (EFFEXOR-XR) 150 MG 24 hr capsule Take 150 mg by mouth 2 (two) times daily.    Yes Historical Provider, MD  fluticasone (FLONASE) 50 MCG/ACT nasal spray Place 2 sprays into both nostrils daily. Patient not taking: Reported on 11/20/2015 01/09/13   Dale Durhamharlene Satvik Parco, MD  ondansetron (ZOFRAN ODT) 4 MG disintegrating tablet Take 1 tablet (4 mg total) by mouth every 8 (eight) hours as needed for nausea or vomiting. 11/20/15   Pricilla LovelessScott Leonda Cristo, MD    Family History Family History  Problem Relation Age of Onset  . Alcohol abuse Mother   . Arthritis Mother   . Hyperlipidemia Mother   . Hypertension Mother   . Alcohol abuse Father   . Arthritis Father   . Hyperlipidemia Father   . Hypertension Father     Social History Social History  Substance Use Topics  . Smoking status: Never Smoker  . Smokeless tobacco: Never Used  . Alcohol use No     Allergies   Codeine and Morphine and related   Review of Systems Review of Systems  Constitutional: Negative for fever.  Respiratory: Negative for shortness of breath.   Cardiovascular: Negative for chest pain.  Gastrointestinal: Positive for vomiting. Negative for abdominal distention, abdominal pain, diarrhea  and nausea.  Genitourinary: Negative for dysuria and frequency.  All other systems reviewed and are negative.    Physical Exam Updated Vital Signs BP 148/74   Pulse 103   Temp 98.1 F (36.7 C) (Oral)   Resp 16   SpO2 99%   Physical Exam  Constitutional: She is oriented to person, place, and time. She appears well-developed and well-nourished. No distress.  HENT:  Head: Normocephalic and atraumatic.  Right Ear: External ear normal.  Left Ear: External ear normal.  Nose: Nose normal.  Eyes:  Right eye exhibits no discharge. Left eye exhibits no discharge.  Cardiovascular: Normal rate, regular rhythm and normal heart sounds.   HR ~ 100  Pulmonary/Chest: Effort normal and breath sounds normal.  Abdominal: Soft. She exhibits no distension. There is no tenderness.  Neurological: She is alert and oriented to person, place, and time.  Skin: Skin is warm and dry. She is not diaphoretic.  Nursing note and vitals reviewed.    ED Treatments / Results  Labs (all labs ordered are listed, but only abnormal results are displayed) Labs Reviewed  URINALYSIS, ROUTINE W REFLEX MICROSCOPIC (NOT AT [facility]) - Abnormal; Notable for the following:       Result Value   Ketones, ur 15 (*)    Protein, ur 30 (*)    Leukocytes, UA SMALL (*)    All other components within normal limits  COMPREHENSIVE METABOLIC PANEL - Abnormal; Notable for the following:    Chloride 100 (*)    Glucose, Bld 144 (*)    Total Protein 8.4 (*)    Alkaline Phosphatase 129 (*)    GFR calc non Af Amer 52 (*)    All other components within normal limits  URINE MICROSCOPIC-ADD ON - Abnormal; Notable for the following:    Squamous Epithelial / LPF 6-30 (*)    Bacteria, UA FEW (*)    Casts HYALINE CASTS (*)    All other components within normal limits  LIPASE, BLOOD  CBC WITH DIFFERENTIAL/PLATELET    EKG  EKG Interpretation  Date/Time:  Thursday November 20 2015 16:28:35 EDT Ventricular Rate:  96 PR Interval:    QRS Duration: 97 QT Interval:  410 QTC Calculation: 519 R Axis:   77 Text Interpretation:  Sinus rhythm Atrial premature complex Borderline T wave abnormalities Prolonged QT interval no significant change since 2016 Confirmed by Rolanda Campa MD, Rashida Ladouceur [phone]) on 11/20/2015 4:44:55 PM       Radiology Dg Abd 2 Views  Result Date: 11/20/2015 CLINICAL DATA:  Vomiting EXAM: ABDOMEN - 2 VIEW COMPARISON:  None. FINDINGS: There is no bowel dilatation to suggest obstruction. There is no evidence of  pneumoperitoneum, portal venous gas or pneumatosis. There are no pathologic calcifications along the expected course of the ureters. Mild osteoarthritis of the right hip. Mild osteoarthritis of bilateral sacroiliac joints. Degenerative changes of the lower lumbar spine. IMPRESSION: Negative. Electronically Signed   By: Elige Ko   On: 11/20/2015 16:24    Procedures Procedures (including critical care time)  Medications Ordered in ED Medications  sodium chloride 0.9 % bolus 500 mL (0 mLs Intravenous Stopped 11/20/15 1855)  ondansetron (ZOFRAN-ODT) disintegrating tablet 4 mg (4 mg Oral Given 11/20/15 1601)     Initial Impression / Assessment and Plan / ED Course  I have reviewed the triage vital signs and the nursing notes.  Pertinent labs & imaging results that were available during my care of the patient were reviewed by me and considered in my  medical decision making (see chart for details).  Clinical Course  Comment By Time  Patient overall appears well. No current vomiting. No focal findings or complaints besides vomiting after PO intake.  Pricilla Loveless, MD 10/26 1541  Tolerated oral water without vomiting after zofran. Overall appears well. Xray benign. Waiting on labs Pricilla Loveless, MD 10/26 1725  Still feels well, no vomiting. Labs benign. Urine with some squamous cells and only small leuks with no symptoms. Likely not UTI. D/c with zofran prn and f/u with PCP Pricilla Loveless, MD 10/26 1824    No vomiting currently. Benign exam. Probably a viral illness. D/c home with return precautions.  Final Clinical Impressions(s) / ED Diagnoses   Final diagnoses:  Vomiting in adult    New Prescriptions New Prescriptions   ONDANSETRON (ZOFRAN ODT) 4 MG DISINTEGRATING TABLET    Take 1 tablet (4 mg total) by mouth every 8 (eight) hours as needed for nausea or vomiting.     Pricilla Loveless, MD 11/20/15 [phone]

## 2015-12-11 ENCOUNTER — Ambulatory Visit (INDEPENDENT_AMBULATORY_CARE_PROVIDER_SITE_OTHER): Payer: Medicare Other

## 2015-12-11 VITALS — BP 120/62 | HR 84 | Temp 98.2°F | Resp 14 | Ht 61.0 in | Wt 134.1 lb

## 2015-12-11 DIAGNOSIS — Z0000 Encounter for general adult medical examination without abnormal findings: Secondary | ICD-10-CM

## 2015-12-11 NOTE — Patient Instructions (Addendum)
  Ms. Lucas MallowRivenbark , Thank you for taking time to come for your Medicare Wellness Visit. I appreciate your ongoing commitment to your health goals. Please review the following plan we discussed and let me know if I can assist you in the future.   FOLLOW UP WITH DR. Lorin PicketSCOTT AS NEEDED.  These are the goals we discussed: Goals    . Maintain          CONTINUE DRINKING PLENTY OF WATER CONTINUE TO STAY ACTIVE WITH HOME EXERCISES AND GOING OUT WITH YOUR FRIENDS LOW CARB FOODS.  LEAN MEATS, VEGETABLES.       This is a list of the screening recommended for you and due dates:  Health Maintenance  Topic Date Due  . DEXA scan (bone density measurement)  09/03/2016*  . Shingles Vaccine  09/03/2016*  . Tetanus Vaccine  09/03/2016*  . Pneumonia vaccines (1 of 2 - PCV13) 09/03/2016*  . Flu Shot  10/08/2016*  *Topic was postponed. The date shown is not the original due date.

## 2015-12-11 NOTE — Progress Notes (Signed)
Subjective:   Sarah [name] is a 82 y.o. female who presents for an Initial Medicare Annual Wellness Visit.  Review of Systems    No ROS.  Medicare Wellness Visit.  Cardiac Risk Factors include: advanced age (>47men, >41 women);hypertension     Objective:    Today's Vitals   12/11/15 0940  BP: 120/62  Pulse: 84  Resp: 14  Temp: 98.2 F (36.8 C)  TempSrc: Oral  SpO2: 96%  Weight: 134 lb 1.9 oz (60.8 kg)  Height: 5\' 1"  (1.549 m)   Body mass index is 25.34 kg/m.   Current Medications (verified) Outpatient Encounter Prescriptions as of 12/11/2015  Medication Sig  . ALPRAZolam (XANAX) 0.5 MG tablet Take 0.25-0.5 mg by mouth daily as needed for anxiety.   Marland Kitchen amLODipine (NORVASC) 5 MG tablet take 1 tablet by mouth once daily  . latanoprost (XALATAN) 0.005 % ophthalmic solution   . Multiple Vitamins-Minerals (CENTRUM SILVER PO) Take 1 tablet by mouth daily.  . ondansetron (ZOFRAN ODT) 4 MG disintegrating tablet Take 1 tablet (4 mg total) by mouth every 8 (eight) hours as needed for nausea or vomiting.  . timolol (TIMOPTIC-XR) 0.5 % ophthalmic gel-forming   . venlafaxine XR (EFFEXOR-XR) 150 MG 24 hr capsule Take 150 mg by mouth 2 (two) times daily.   . fluticasone (FLONASE) 50 MCG/ACT nasal spray Place 2 sprays into both nostrils daily. (Patient not taking: Reported on 12/11/2015)   No facility-administered encounter medications on file as of 12/11/2015.     Allergies (verified) Codeine and Morphine and related   History: Past Medical History:  Diagnosis Date  . Arthritis   . Depression   . Glaucoma   . Hypercholesterolemia   . Hypertension   . Subclinical hypothyroidism    Past Surgical History:  Procedure Laterality Date  . ABDOMINAL HYSTERECTOMY  1978  . HIP FRACTURE SURGERY Right    Family History  Problem Relation Age of Onset  . Alcohol abuse Mother   . Arthritis Mother   . Hyperlipidemia Mother   . Hypertension Mother   . Alcohol abuse Father    . Arthritis Father   . Hyperlipidemia Father   . Hypertension Father    Social History   Occupational History  . Not on file.   Social History Main Topics  . Smoking status: Never Smoker  . Smokeless tobacco: Never Used  . Alcohol use No  . Drug use: No  . Sexual activity: Not Currently    Tobacco Counseling Counseling given: Not Answered   Activities of Daily Living In your present state of health, do you have any difficulty performing the following activities: 12/11/2015  Hearing? Y  Vision? Y  Difficulty concentrating or making decisions? N  Walking or climbing stairs? N  Dressing or bathing? N  Doing errands, shopping? Y  Preparing Food and eating ? Y  Using the Toilet? N  In the past six months, have you accidently leaked urine? N  Do you have problems with loss of bowel control? N  Managing your Medications? Y  Managing your Finances? Y  Housekeeping or managing your Housekeeping? Y  Some recent data might be hidden    Immunizations and Health Maintenance  There is no immunization history on file for this patient. There are no preventive care reminders to display for this patient.  Patient Care Team: Dale Bradley, MD as PCP - General (Internal Medicine)  Indicate any recent Medical Services you may have received from other than [facility] providers  in the past year (date may be approximate).     Assessment:   This is a routine wellness examination for Sarah [name]. The goal of the wellness visit is to assist the patient how to close the gaps in care and create a preventative care plan for the patient.   Taking calcium VIT D as appropriate/Osteoporosis risk reviewed.  Medications reviewed; taking without issues or barriers.  Safety issues reviewed; lives alone.  Alarm system with smoke detectors in the home. No firearms in the home. Wears seatbelts when riding with others. No violence in the home.  No identified risk were noted; The patient was  oriented; appropriate in dress and manner and no objective failures at ADL's or IADL's.   BMI; discussed the importance of a healthy diet, water intake and exercise. She has a sensible diet, adequate water intake and completes exercises 2-3 times a week by doing bed/chair ROM stretches.    HTN;  Well controlled with medication.  Stable and followed by PCP.   Health maintenance gaps; closed.  Patient Concerns: None at this time. Follow up with PCP as needed.  Hearing/Vision screen Hearing Screening Comments: Hearing aids, bilateral Vision Screening Comments: [facility] Macular degeneration Glaucoma Does not wear glasses as directed Annual visits Last OV 06/2015   Dietary issues and exercise activities discussed: Current Exercise Habits: Home exercise routine (Home physical therapy exercises.  Chair/bed exercises.), Time (Minutes): 10, Frequency (Times/Week): 3, Weekly Exercise (Minutes/Week): 30, Intensity: Mild  Goals    . Maintain          CONTINUE DRINKING PLENTY OF WATER CONTINUE TO STAY ACTIVE WITH HOME EXERCISES AND GOING OUT WITH YOUR FRIENDS LOW CARB FOODS.  LEAN MEATS, VEGETABLES.      Depression Screen PHQ 2/9 Scores 12/11/2015 10/09/2015 03/09/2013 05/06/2012  PHQ - 2 Score 0 0 0 0    Fall Risk Fall Risk  12/11/2015 10/09/2015 03/09/2013 05/06/2012 02/22/2012  Falls in the past year? No No Yes Yes Yes  Number falls in past yr: - - 1 1 1   Injury with Fall? - - Yes Yes No  Risk for fall due to : - - - Other (Comment) Other (Comment)  Risk for fall due to (comments): - - - - she slipped on her sock    Cognitive Function:     6CIT Screen 12/11/2015  What Year? 4 points  What month? 0 points  What time? (No Data)  Count back from 20 0 points  Months in reverse 0 points  Repeat phrase 2 points    Screening Tests Health Maintenance  Topic Date Due  . DEXA SCAN  09/03/2016 (Originally 10/28/1990)  . ZOSTAVAX  09/03/2016 (Originally  10/27/1985)  . TETANUS/TDAP  09/03/2016 (Originally [birth year]27/1944)  . PNA vac Low Risk Adult (1 of 2 - PCV13) 09/03/2016 (Originally 10/28/1990)  . INFLUENZA VACCINE  10/08/2016 (Originally 08/26/2015)      Plan:    End of life planning; Advance aging; Advanced directives discussed. Copy of current HCPOA/Living Will requested.  Medicare Attestation I have personally reviewed: The patient's medical and social history Their use of alcohol, tobacco or illicit drugs Their current medications and supplements The patient's functional ability including ADLs,fall risks, home safety risks, cognitive, and hearing and visual impairment Diet and physical activities Evidence for depression   The patient's weight, height, BMI, and visual acuity have been recorded in the chart.  I have made referrals and provided education to the patient based on review of the  above and I have provided the patient with a written personalized care plan for preventive services.    During the course of the visit, Sarah [name] was educated and counseled about the following appropriate screening and preventive services:   Vaccines to include Pneumoccal, Influenza, Hepatitis B, Td, Zostavax, HCV  Electrocardiogram  Cardiovascular disease screening  Colorectal cancer screening  Bone density screening  Diabetes screening  Glaucoma screening  Mammography/PAP  Nutrition counseling  Smoking cessation counseling  Patient Instructions (the written plan) were given to the patient.    Ashok PallOBrien-Blaney, Devany Aja L, LPN   16/10/960411/16/2017    Reviewed above information.  Agree with plan.    Dr Lorin PicketScott

## 2015-12-31 ENCOUNTER — Telehealth: Payer: Self-pay | Admitting: Internal Medicine

## 2015-12-31 NOTE — Telephone Encounter (Signed)
pts daughter Nevada CraneKathy Pickard [phone]  -  Contact number.

## 2016-01-12 DIAGNOSIS — F331 Major depressive disorder, recurrent, moderate: Secondary | ICD-10-CM | POA: Diagnosis not present

## 2016-01-17 ENCOUNTER — Other Ambulatory Visit: Payer: Self-pay | Admitting: Internal Medicine

## 2016-02-17 ENCOUNTER — Ambulatory Visit: Payer: Medicare Other | Admitting: Internal Medicine

## 2016-02-17 DIAGNOSIS — H401132 Primary open-angle glaucoma, bilateral, moderate stage: Secondary | ICD-10-CM | POA: Diagnosis not present

## 2016-03-09 ENCOUNTER — Telehealth: Payer: Self-pay | Admitting: Internal Medicine

## 2016-03-09 ENCOUNTER — Ambulatory Visit: Payer: Self-pay | Admitting: Family

## 2016-03-09 NOTE — Telephone Encounter (Signed)
FYI - Pt cancelled appt does not wish to come.

## 2016-03-10 NOTE — Telephone Encounter (Signed)
FYI

## 2016-03-10 NOTE — Telephone Encounter (Signed)
noted 

## 2016-03-29 DIAGNOSIS — R69 Illness, unspecified: Secondary | ICD-10-CM | POA: Diagnosis not present

## 2016-04-19 ENCOUNTER — Ambulatory Visit: Payer: Medicare Other | Admitting: Internal Medicine

## 2016-06-08 ENCOUNTER — Ambulatory Visit (INDEPENDENT_AMBULATORY_CARE_PROVIDER_SITE_OTHER): Payer: Medicare HMO | Admitting: Family Medicine

## 2016-06-08 ENCOUNTER — Encounter: Payer: Self-pay | Admitting: Family Medicine

## 2016-06-08 DIAGNOSIS — J988 Other specified respiratory disorders: Secondary | ICD-10-CM | POA: Diagnosis not present

## 2016-06-08 MED ORDER — OLOPATADINE HCL 0.2 % OP SOLN: OPHTHALMIC | 0 refills | Status: AC

## 2016-06-08 NOTE — Progress Notes (Signed)
   Subjective:  Patient ID: Sarah [name], female    DOB: 05/08/1923  Age: 83 y.o. MRN: [id number]  CC: URI   HPI:  83 year old female presents with respiratory symptoms.  Patient states that she recently went to the beach. She came back early as she was not feeling well. She's had itchy watery eyes, rhinorrhea, laryngitis, sore throat, ear pain, cough. Cough is slightly productive. No associated fevers or chills. She's been using Tylenol without significant improvement. No known exacerbating or relieving factors. No other associated symptoms. No other complaints or concerns at this time.  Social Hx   Social History   Social History  . Marital status: Widowed    Spouse name: N/A  . Number of children: N/A  . Years of education: N/A   Social History Main Topics  . Smoking status: Never Smoker  . Smokeless tobacco: Never Used  . Alcohol use No  . Drug use: No  . Sexual activity: Not Currently   Other Topics Concern  . None   Social History Narrative  . None    Review of Systems  Constitutional: Negative for fever.  HENT: Positive for ear pain, rhinorrhea and sore throat.   Eyes: Positive for discharge and itching.  Respiratory: Positive for cough.     Objective:  BP (!) 150/68   Pulse (!) 105   Temp 98.2 F (36.8 C) (Oral)   Wt 134 lb 6 oz (61 kg)   SpO2 95%   BMI 25.39 kg/m   BP/Weight 06/08/2016 12/11/2015 11/20/2015  Systolic BP 150 120 148  Diastolic BP 68 62 74  Wt. (Lbs) 134.38 134.12 -  BMI 25.39 25.34 -    Physical Exam  Constitutional: She appears well-developed. No distress.  HENT:  Head: Normocephalic and atraumatic.  Very dry mucousy membranes. Normal TMs bilaterally.  Eyes: Right eye exhibits no discharge.  Mild conjunctival injection.  Cardiovascular: Normal rate and regular rhythm.   Murmur heard. Pulmonary/Chest: Effort normal and breath sounds normal. She has no wheezes. She has no rales.  Neurological: She is alert.    Psychiatric: She has a normal mood and affect.  Vitals reviewed.   Lab Results  Component Value Date   WBC 8.7 11/20/2015   HGB 14.5 11/20/2015   HCT 43.4 11/20/2015   PLT 341 11/20/2015   GLUCOSE 144 (H) 11/20/2015   CHOL 211 (H) 04/07/2015   TRIG 173.0 (H) 04/07/2015   HDL 60.80 04/07/2015   LDLDIRECT 141.7 08/23/2013   LDLCALC 115 (H) 04/07/2015   ALT 22 11/20/2015   AST 31 11/20/2015   NA 137 11/20/2015   K 3.8 11/20/2015   CL 100 (L) 11/20/2015   CREATININE 0.94 11/20/2015   BUN 15 11/20/2015   CO2 24 11/20/2015   TSH 0.10 (L) 10/09/2015   INR 1.0 05/05/2012   HGBA1C 5.8 10/09/2015    Assessment & Plan:   Problem List Items Addressed This Visit    Respiratory infection    New problem. Appears to be viral. Treating eye itching and watering with Pataday. No other medications or interventions needed at this time. Supportive care.         Meds ordered this encounter  Medications  . Olopatadine HCl 0.2 % SOLN    Sig: 1 drop to each eye daily.    Dispense:  2.5 mL    Refill:  0   Follow-up: PRN  Everlene OtherJayce Armondo Cech DO [facility] [place]

## 2016-06-08 NOTE — Assessment & Plan Note (Signed)
New problem. Appears to be viral. Treating eye itching and watering with Pataday. No other medications or interventions needed at this time. Supportive care.

## 2016-06-08 NOTE — Patient Instructions (Signed)
Use the drop daily.  Follow up as needed.  Take care  Dr. Adriana Simasook

## 2016-06-27 ENCOUNTER — Other Ambulatory Visit: Payer: Self-pay | Admitting: Internal Medicine

## 2016-07-05 DIAGNOSIS — F331 Major depressive disorder, recurrent, moderate: Secondary | ICD-10-CM | POA: Diagnosis not present

## 2016-08-02 ENCOUNTER — Ambulatory Visit: Payer: Medicare Other | Admitting: Internal Medicine

## 2016-08-17 DIAGNOSIS — H401132 Primary open-angle glaucoma, bilateral, moderate stage: Secondary | ICD-10-CM | POA: Diagnosis not present

## 2016-09-09 ENCOUNTER — Other Ambulatory Visit: Payer: Self-pay | Admitting: Family Medicine

## 2016-09-09 NOTE — Telephone Encounter (Signed)
Not my patient

## 2016-09-09 NOTE — Telephone Encounter (Signed)
Last filled 06/08/16, last office visit 06/08/16

## 2016-09-10 NOTE — Telephone Encounter (Signed)
This was given to her for an acute problem by Dr Adriana Simas.   Does she still need?  If still having issues, may need to be reevaluated.

## 2016-10-13 ENCOUNTER — Encounter: Payer: Self-pay | Admitting: Internal Medicine

## 2016-10-13 ENCOUNTER — Ambulatory Visit (INDEPENDENT_AMBULATORY_CARE_PROVIDER_SITE_OTHER): Payer: Medicare HMO | Admitting: Internal Medicine

## 2016-10-13 VITALS — BP 128/68 | HR 96 | Temp 98.6°F | Resp 14 | Ht 61.0 in | Wt 132.4 lb

## 2016-10-13 DIAGNOSIS — F329 Major depressive disorder, single episode, unspecified: Secondary | ICD-10-CM

## 2016-10-13 DIAGNOSIS — R69 Illness, unspecified: Secondary | ICD-10-CM | POA: Diagnosis not present

## 2016-10-13 DIAGNOSIS — E7800 Pure hypercholesterolemia, unspecified: Secondary | ICD-10-CM | POA: Diagnosis not present

## 2016-10-13 DIAGNOSIS — R739 Hyperglycemia, unspecified: Secondary | ICD-10-CM | POA: Diagnosis not present

## 2016-10-13 DIAGNOSIS — F32A Depression, unspecified: Secondary | ICD-10-CM

## 2016-10-13 DIAGNOSIS — I10 Essential (primary) hypertension: Secondary | ICD-10-CM | POA: Diagnosis not present

## 2016-10-13 DIAGNOSIS — Z23 Encounter for immunization: Secondary | ICD-10-CM

## 2016-10-13 DIAGNOSIS — R9389 Abnormal findings on diagnostic imaging of other specified body structures: Secondary | ICD-10-CM

## 2016-10-13 DIAGNOSIS — R938 Abnormal findings on diagnostic imaging of other specified body structures: Secondary | ICD-10-CM

## 2016-10-13 DIAGNOSIS — E039 Hypothyroidism, unspecified: Secondary | ICD-10-CM | POA: Diagnosis not present

## 2016-10-13 DIAGNOSIS — E038 Other specified hypothyroidism: Secondary | ICD-10-CM

## 2016-10-13 DIAGNOSIS — R1310 Dysphagia, unspecified: Secondary | ICD-10-CM | POA: Diagnosis not present

## 2016-10-13 LAB — HEPATIC FUNCTION PANEL
ALBUMIN: 4.1 g/dL (ref 3.5–5.2)
ALK PHOS: 107 U/L (ref 39–117)
ALT: 16 U/L (ref 0–35)
AST: 20 U/L (ref 0–37)
BILIRUBIN DIRECT: 0.1 mg/dL (ref 0.0–0.3)
TOTAL PROTEIN: 7.1 g/dL (ref 6.0–8.3)
Total Bilirubin: 0.4 mg/dL (ref 0.2–1.2)

## 2016-10-13 LAB — CBC WITH DIFFERENTIAL/PLATELET
BASOS ABS: 0.1 10*3/uL (ref 0.0–0.1)
Basophils Relative: 0.9 % (ref 0.0–3.0)
EOS PCT: 7.9 % — AB (ref 0.0–5.0)
Eosinophils Absolute: 0.6 10*3/uL (ref 0.0–0.7)
HEMATOCRIT: 41 % (ref 36.0–46.0)
HEMOGLOBIN: 13.2 g/dL (ref 12.0–15.0)
LYMPHS ABS: 1.8 10*3/uL (ref 0.7–4.0)
LYMPHS PCT: 24.9 % (ref 12.0–46.0)
MCHC: 32.2 g/dL (ref 30.0–36.0)
MCV: 90 fl (ref 78.0–100.0)
MONOS PCT: 7.4 % (ref 3.0–12.0)
Monocytes Absolute: 0.5 10*3/uL (ref 0.1–1.0)
Neutro Abs: 4.4 10*3/uL (ref 1.4–7.7)
Neutrophils Relative %: 58.9 % (ref 43.0–77.0)
Platelets: 355 10*3/uL (ref 150.0–400.0)
RBC: 4.56 Mil/uL (ref 3.87–5.11)
RDW: 12.8 % (ref 11.5–15.5)
WBC: 7.4 10*3/uL (ref 4.0–10.5)

## 2016-10-13 LAB — LIPID PANEL
CHOL/HDL RATIO: 3
Cholesterol: 222 mg/dL — ABNORMAL HIGH (ref 0–200)
HDL: 72.4 mg/dL (ref >39.00)
LDL Cholesterol: 115 mg/dL — ABNORMAL HIGH (ref 0–99)
NONHDL: 149.42
Triglycerides: 171 mg/dL — ABNORMAL HIGH (ref 0.0–149.0)
VLDL: 34.2 mg/dL (ref 0.0–40.0)

## 2016-10-13 LAB — BASIC METABOLIC PANEL
BUN: 14 mg/dL (ref 6–23)
CALCIUM: 10.4 mg/dL (ref 8.4–10.5)
CO2: 31 mEq/L (ref 19–32)
CREATININE: 0.82 mg/dL (ref 0.40–1.20)
Chloride: 102 mEq/L (ref 96–112)
GFR: 69.46 mL/min (ref >60.00)
GLUCOSE: 112 mg/dL — AB (ref 70–99)
POTASSIUM: 5 meq/L (ref 3.5–5.1)
SODIUM: 140 meq/L (ref 135–145)

## 2016-10-13 LAB — HEMOGLOBIN A1C: Hgb A1c MFr Bld: 5.9 % (ref 4.6–6.5)

## 2016-10-13 LAB — T4, FREE: Free T4: 1.24 ng/dL (ref 0.60–1.60)

## 2016-10-13 LAB — TSH: TSH: 0.04 u[IU]/mL — AB (ref 0.35–4.50)

## 2016-10-13 LAB — T3, FREE: T3, Free: 3 pg/mL (ref 2.3–4.2)

## 2016-10-13 NOTE — Progress Notes (Signed)
Patient ID: CORBYN STEEDMAN, female   DOB: [birth year], 83 y.o.   MRN: [id number]   Subjective:    Patient ID: ANISA LEANOS, female    DOB: 1923-06-05, 83 y.o.   MRN: [id number]  HPI  Patient here for a scheduled follow up. She reports she is doing well.  Has someone staying with her.  Tries to stay active.  No chest pain.  No sob.  No acid reflux.  Reports no problems swallowing.  No abdominal pain.  Denies acid reflux.  Bowels stable.  Still seeing Dr Octavia Heir.  Stable.    Past Medical History:  Diagnosis Date  . Arthritis   . Depression   . Glaucoma   . Hypercholesterolemia   . Hypertension   . Subclinical hypothyroidism    Past Surgical History:  Procedure Laterality Date  . ABDOMINAL HYSTERECTOMY  1978  . HIP FRACTURE SURGERY Right    Family History  Problem Relation Age of Onset  . Alcohol abuse Mother   . Arthritis Mother   . Hyperlipidemia Mother   . Hypertension Mother   . Alcohol abuse Father   . Arthritis Father   . Hyperlipidemia Father   . Hypertension Father    Social History   Social History  . Marital status: Widowed    Spouse name: N/A  . Number of children: N/A  . Years of education: N/A   Social History Main Topics  . Smoking status: Never Smoker  . Smokeless tobacco: Never Used  . Alcohol use No  . Drug use: No  . Sexual activity: Not Currently   Other Topics Concern  . None   Social History Narrative  . None    Outpatient Encounter Prescriptions as of 10/13/2016  Medication Sig  . ALPRAZolam (XANAX) 0.5 MG tablet Take 0.25-0.5 mg by mouth daily as needed for anxiety.   Marland Kitchen amLODipine (NORVASC) 5 MG tablet take 1 tablet by mouth once daily  . fluticasone (FLONASE) 50 MCG/ACT nasal spray Place 2 sprays into both nostrils daily.  Marland Kitchen latanoprost (XALATAN) 0.005 % ophthalmic solution   . Multiple Vitamins-Minerals (CENTRUM SILVER PO) Take 1 tablet by mouth daily.  . Olopatadine HCl 0.2 % SOLN 1 drop to each eye daily.  .  ondansetron (ZOFRAN ODT) 4 MG disintegrating tablet Take 1 tablet (4 mg total) by mouth every 8 (eight) hours as needed for nausea or vomiting.  . timolol (TIMOPTIC-XR) 0.5 % ophthalmic gel-forming   . venlafaxine XR (EFFEXOR-XR) 150 MG 24 hr capsule Take 150 mg by mouth 2 (two) times daily.    No facility-administered encounter medications on file as of 10/13/2016.     Review of Systems  Constitutional: Negative for appetite change and unexpected weight change.  HENT: Negative for congestion and sinus pressure.   Respiratory: Negative for cough, chest tightness and shortness of breath.   Cardiovascular: Negative for chest pain, palpitations and leg swelling.  Gastrointestinal: Negative for abdominal pain, diarrhea, nausea and vomiting.  Genitourinary: Negative for difficulty urinating and dysuria.  Musculoskeletal: Negative for back pain.       Some hand stiffness and joint swelling.  Declines further evaluation and w/up.    Skin: Negative for color change and rash.  Neurological: Negative for dizziness, light-headedness and headaches.  Psychiatric/Behavioral: Negative for agitation and dysphoric mood.       Objective:     Blood pressure rechecked by me:  128/68  Physical Exam  Constitutional: She appears well-developed and well-nourished. No distress.  HENT:  Nose: Nose normal.  Mouth/Throat: Oropharynx is clear and moist.  Neck: Neck supple. No thyromegaly present.  Cardiovascular: Normal rate and regular rhythm.   Pulmonary/Chest: Breath sounds normal. No respiratory distress. She has no wheezes.  Abdominal: Soft. Bowel sounds are normal. There is no tenderness.  Musculoskeletal: She exhibits no edema or tenderness.  Lymphadenopathy:    She has no cervical adenopathy.  Skin: No rash noted. No erythema.  Psychiatric: She has a normal mood and affect. Her behavior is normal.    BP 128/68   Pulse 96   Temp 98.6 F (37 C) (Oral)   Resp 14   Ht 5' 1"  (1.549 m)   Wt 132  lb 6.4 oz (60.1 kg)   SpO2 98%   BMI 25.02 kg/m  Wt Readings from Last 3 Encounters:  10/13/16 132 lb 6.4 oz (60.1 kg)  06/08/16 134 lb 6 oz (61 kg)  12/11/15 134 lb 1.9 oz (60.8 kg)     Lab Results  Component Value Date   WBC 7.4 10/13/2016   HGB 13.2 10/13/2016   HCT 41.0 10/13/2016   PLT 355.0 10/13/2016   GLUCOSE 112 (H) 10/13/2016   CHOL 222 (H) 10/13/2016   TRIG 171.0 (H) 10/13/2016   HDL 72.40 10/13/2016   LDLDIRECT 141.7 08/23/2013   LDLCALC 115 (H) 10/13/2016   ALT 16 10/13/2016   AST 20 10/13/2016   NA 140 10/13/2016   K 5.0 10/13/2016   CL 102 10/13/2016   CREATININE 0.82 10/13/2016   BUN 14 10/13/2016   CO2 31 10/13/2016   TSH 0.04 (L) 10/13/2016   INR 1.0 05/05/2012   HGBA1C 5.9 10/13/2016    Dg Abd 2 Views  Result Date: 11/20/2015 CLINICAL DATA:  Vomiting EXAM: ABDOMEN - 2 VIEW COMPARISON:  None. FINDINGS: There is no bowel dilatation to suggest obstruction. There is no evidence of pneumoperitoneum, portal venous gas or pneumatosis. There are no pathologic calcifications along the expected course of the ureters. Mild osteoarthritis of the right hip. Mild osteoarthritis of bilateral sacroiliac joints. Degenerative changes of the lower lumbar spine. IMPRESSION: Negative. Electronically Signed   By: Kathreen Devoid   On: 11/20/2015 16:24       Assessment & Plan:   Problem List Items Addressed This Visit    Abnormal chest CT    Discussed with her again as outlined.  She declines f/u scanning.        Depression    Followed by Dr Clovis Riley.  Stable.        Dysphagia    Previous CXR and CT scan as outlined previously.  Discussed with her again today regarding further w/up and repeat scanning.  She declines.  Reports no dysphagia now.  Follow.        Hypercholesterolemia - Primary    Low cholesterol diet and exercise.  Follow lipid panel.        Relevant Orders   Hepatic function panel (Completed)   Lipid panel (Completed)   Hyperglycemia    Low carb  diet and exercise.  Follow met b and a1c.        Relevant Orders   Hemoglobin A1c (Completed)   Hypertension    Blood pressure rechecked wnl.  Same medication regimen.  Follow pressures.  Follow metabolic panel.        Relevant Orders   CBC with Differential/Platelet (Completed)   Basic metabolic panel (Completed)   Subclinical hypothyroidism    TSH has previously been suppressed.  Free T3 and  Free T4 wnl.  Recheck thyroid function tests.        Relevant Orders   TSH (Completed)   T4, free (Completed)   T3, free (Completed)    Other Visit Diagnoses    Need for pneumococcal vaccination       Encounter for immunization       Relevant Orders   Flu vaccine HIGH DOSE PF (Completed)       Einar Pheasant, MD

## 2016-10-16 ENCOUNTER — Encounter: Payer: Self-pay | Admitting: Internal Medicine

## 2016-10-16 NOTE — Assessment & Plan Note (Signed)
Followed by Dr Lenis Noon.  Stable.

## 2016-10-16 NOTE — Assessment & Plan Note (Signed)
Blood pressure rechecked wnl.  Same medication regimen.  Follow pressures.  Follow metabolic panel.

## 2016-10-16 NOTE — Assessment & Plan Note (Signed)
Low cholesterol diet and exercise.  Follow lipid panel.   

## 2016-10-16 NOTE — Assessment & Plan Note (Signed)
Discussed with her again as outlined.  She declines f/u scanning.

## 2016-10-16 NOTE — Assessment & Plan Note (Signed)
Low carb diet and exercise.  Follow met b and a1c.   

## 2016-10-16 NOTE — Assessment & Plan Note (Signed)
TSH has previously been suppressed.  Free T3 and Free T4 wnl.  Recheck thyroid function tests.

## 2016-10-16 NOTE — Assessment & Plan Note (Signed)
Previous CXR and CT scan as outlined previously.  Discussed with her again today regarding further w/up and repeat scanning.  She declines.  Reports no dysphagia now.  Follow.

## 2016-10-28 DIAGNOSIS — F331 Major depressive disorder, recurrent, moderate: Secondary | ICD-10-CM | POA: Diagnosis not present

## 2016-12-10 ENCOUNTER — Ambulatory Visit: Payer: Self-pay

## 2016-12-15 ENCOUNTER — Inpatient Hospital Stay: Payer: MEDICARE

## 2016-12-15 ENCOUNTER — Emergency Department: Payer: MEDICARE

## 2016-12-15 ENCOUNTER — Other Ambulatory Visit: Payer: Self-pay

## 2016-12-15 ENCOUNTER — Encounter: Payer: Self-pay | Admitting: Certified Registered Nurse Anesthetist

## 2016-12-15 ENCOUNTER — Encounter
Admission: EM | Disposition: A | Discharge status: Home / Self Care | Payer: Self-pay | Source: Home / Self Care | Attending: Internal Medicine

## 2016-12-15 ENCOUNTER — Inpatient Hospital Stay
Admission: EM | Admit: 2016-12-15 | Discharge: 2016-12-16 | DRG: 086 | Disposition: A | Discharge status: Home / Self Care | Payer: MEDICARE | Attending: Internal Medicine | Admitting: Internal Medicine

## 2016-12-15 ENCOUNTER — Encounter: Payer: Self-pay | Admitting: *Deleted

## 2016-12-15 DIAGNOSIS — S0011XA Contusion of right eyelid and periocular area, initial encounter: Secondary | ICD-10-CM | POA: Diagnosis not present

## 2016-12-15 DIAGNOSIS — E049 Nontoxic goiter, unspecified: Secondary | ICD-10-CM | POA: Diagnosis not present

## 2016-12-15 DIAGNOSIS — I10 Essential (primary) hypertension: Secondary | ICD-10-CM | POA: Diagnosis not present

## 2016-12-15 DIAGNOSIS — S0990XA Unspecified injury of head, initial encounter: Secondary | ICD-10-CM | POA: Diagnosis not present

## 2016-12-15 DIAGNOSIS — R918 Other nonspecific abnormal finding of lung field: Secondary | ICD-10-CM | POA: Diagnosis not present

## 2016-12-15 DIAGNOSIS — Z79899 Other long term (current) drug therapy: Secondary | ICD-10-CM

## 2016-12-15 DIAGNOSIS — R221 Localized swelling, mass and lump, neck: Secondary | ICD-10-CM

## 2016-12-15 DIAGNOSIS — M1990 Unspecified osteoarthritis, unspecified site: Secondary | ICD-10-CM | POA: Diagnosis not present

## 2016-12-15 DIAGNOSIS — I609 Nontraumatic subarachnoid hemorrhage, unspecified: Secondary | ICD-10-CM

## 2016-12-15 DIAGNOSIS — Z8249 Family history of ischemic heart disease and other diseases of the circulatory system: Secondary | ICD-10-CM | POA: Diagnosis not present

## 2016-12-15 DIAGNOSIS — E042 Nontoxic multinodular goiter: Secondary | ICD-10-CM | POA: Diagnosis present

## 2016-12-15 DIAGNOSIS — Z885 Allergy status to narcotic agent status: Secondary | ICD-10-CM | POA: Diagnosis not present

## 2016-12-15 DIAGNOSIS — E7800 Pure hypercholesterolemia, unspecified: Secondary | ICD-10-CM | POA: Diagnosis present

## 2016-12-15 DIAGNOSIS — Y92008 Other place in unspecified non-institutional (private) residence as the place of occurrence of the external cause: Secondary | ICD-10-CM | POA: Diagnosis not present

## 2016-12-15 DIAGNOSIS — Z811 Family history of alcohol abuse and dependence: Secondary | ICD-10-CM | POA: Diagnosis not present

## 2016-12-15 DIAGNOSIS — I619 Nontraumatic intracerebral hemorrhage, unspecified: Secondary | ICD-10-CM | POA: Diagnosis not present

## 2016-12-15 DIAGNOSIS — W0110XA Fall on same level from slipping, tripping and stumbling with subsequent striking against unspecified object, initial encounter: Secondary | ICD-10-CM | POA: Diagnosis present

## 2016-12-15 DIAGNOSIS — Z8261 Family history of arthritis: Secondary | ICD-10-CM | POA: Diagnosis not present

## 2016-12-15 DIAGNOSIS — Y9389 Activity, other specified: Secondary | ICD-10-CM

## 2016-12-15 DIAGNOSIS — Z8349 Family history of other endocrine, nutritional and metabolic diseases: Secondary | ICD-10-CM

## 2016-12-15 DIAGNOSIS — F329 Major depressive disorder, single episode, unspecified: Secondary | ICD-10-CM | POA: Diagnosis not present

## 2016-12-15 DIAGNOSIS — S199XXA Unspecified injury of neck, initial encounter: Secondary | ICD-10-CM | POA: Diagnosis not present

## 2016-12-15 DIAGNOSIS — H409 Unspecified glaucoma: Secondary | ICD-10-CM | POA: Diagnosis present

## 2016-12-15 DIAGNOSIS — W19XXXA Unspecified fall, initial encounter: Secondary | ICD-10-CM | POA: Diagnosis present

## 2016-12-15 DIAGNOSIS — E039 Hypothyroidism, unspecified: Secondary | ICD-10-CM | POA: Diagnosis not present

## 2016-12-15 DIAGNOSIS — S62522A Displaced fracture of distal phalanx of left thumb, initial encounter for closed fracture: Secondary | ICD-10-CM | POA: Diagnosis not present

## 2016-12-15 DIAGNOSIS — Z90710 Acquired absence of both cervix and uterus: Secondary | ICD-10-CM

## 2016-12-15 DIAGNOSIS — R220 Localized swelling, mass and lump, head: Secondary | ICD-10-CM | POA: Diagnosis not present

## 2016-12-15 DIAGNOSIS — S066X0A Traumatic subarachnoid hemorrhage without loss of consciousness, initial encounter: Secondary | ICD-10-CM | POA: Diagnosis not present

## 2016-12-15 DIAGNOSIS — S62502B Fracture of unspecified phalanx of left thumb, initial encounter for open fracture: Secondary | ICD-10-CM | POA: Diagnosis present

## 2016-12-15 DIAGNOSIS — S62292A Other fracture of first metacarpal bone, left hand, initial encounter for closed fracture: Secondary | ICD-10-CM | POA: Diagnosis not present

## 2016-12-15 DIAGNOSIS — R69 Illness, unspecified: Secondary | ICD-10-CM | POA: Diagnosis not present

## 2016-12-15 LAB — URINALYSIS, COMPLETE (UACMP) WITH MICROSCOPIC
Bilirubin Urine: NEGATIVE
GLUCOSE, UA: NEGATIVE mg/dL
HGB URINE DIPSTICK: NEGATIVE
Ketones, ur: NEGATIVE mg/dL
NITRITE: NEGATIVE
PROTEIN: 30 mg/dL — AB
Specific Gravity, Urine: 1.014 (ref 1.005–1.030)
pH: 6 (ref 5.0–8.0)

## 2016-12-15 LAB — CBC WITH DIFFERENTIAL/PLATELET
BASOS ABS: 0 10*3/uL (ref 0–0.1)
Basophils Relative: 0 %
EOS PCT: 1 %
Eosinophils Absolute: 0.1 10*3/uL (ref 0–0.7)
HEMATOCRIT: 36.9 % (ref 35.0–47.0)
Hemoglobin: 12.6 g/dL (ref 12.0–16.0)
LYMPHS PCT: 18 %
Lymphs Abs: 1.9 10*3/uL (ref 1.0–3.6)
MCH: 29.4 pg (ref 26.0–34.0)
MCHC: 34.2 g/dL (ref 32.0–36.0)
MCV: 86.2 fL (ref 80.0–100.0)
Monocytes Absolute: 1.2 10*3/uL — ABNORMAL HIGH (ref 0.2–0.9)
Monocytes Relative: 11 %
NEUTROS ABS: 7.8 10*3/uL — AB (ref 1.4–6.5)
NEUTROS PCT: 70 %
PLATELETS: 293 10*3/uL (ref 150–440)
RBC: 4.29 MIL/uL (ref 3.80–5.20)
RDW: 13.1 % (ref 11.5–14.5)
WBC: 11 10*3/uL (ref 3.6–11.0)

## 2016-12-15 LAB — COMPREHENSIVE METABOLIC PANEL
ALT: 21 U/L (ref 14–54)
ANION GAP: 10 (ref 5–15)
AST: 36 U/L (ref 15–41)
Albumin: 4 g/dL (ref 3.5–5.0)
Alkaline Phosphatase: 131 U/L — ABNORMAL HIGH (ref 38–126)
BILIRUBIN TOTAL: 0.7 mg/dL (ref 0.3–1.2)
BUN: 14 mg/dL (ref 6–20)
CO2: 23 mmol/L (ref 22–32)
Calcium: 9.5 mg/dL (ref 8.9–10.3)
Chloride: 101 mmol/L (ref 101–111)
Creatinine, Ser: 0.71 mg/dL (ref 0.44–1.00)
Glucose, Bld: 134 mg/dL — ABNORMAL HIGH (ref 65–99)
POTASSIUM: 3.7 mmol/L (ref 3.5–5.1)
Sodium: 134 mmol/L — ABNORMAL LOW (ref 135–145)
TOTAL PROTEIN: 7.6 g/dL (ref 6.5–8.1)

## 2016-12-15 LAB — PROTIME-INR
INR: 1.03
Prothrombin Time: 13.4 seconds (ref 11.4–15.2)

## 2016-12-15 LAB — APTT: aPTT: 29 seconds (ref 24–36)

## 2016-12-15 LAB — MRSA PCR SCREENING: MRSA BY PCR: NEGATIVE

## 2016-12-15 SURGERY — CLOSED REDUCTION, FRACTURE, METACARPAL BONE, WITH PERCUTANEOUS PINNING
Anesthesia: Choice | Laterality: Left

## 2016-12-15 MED ORDER — ACETAMINOPHEN 650 MG RE SUPP: 650.0000 mg | Freq: Four times a day (QID) | RECTAL | Status: DC | PRN

## 2016-12-15 MED ORDER — ONDANSETRON HCL 4 MG PO TABS: 4.0000 mg | ORAL_TABLET | Freq: Four times a day (QID) | ORAL | Status: DC | PRN

## 2016-12-15 MED ORDER — ONDANSETRON 4 MG PO TBDP: 4.0000 mg | ORAL_TABLET | Freq: Three times a day (TID) | ORAL | Status: DC | PRN

## 2016-12-15 MED ORDER — ADULT MULTIVITAMIN W/MINERALS CH
1.0000 | ORAL_TABLET | Freq: Every day | ORAL | Status: DC
  Administered 2016-12-16: 1 via ORAL
  Filled 2016-12-15: qty 1

## 2016-12-15 MED ORDER — LATANOPROST 0.005 % OP SOLN
1.0000 [drp] | Freq: Every day | OPHTHALMIC | Status: DC
  Administered 2016-12-15: 1 [drp] via OPHTHALMIC
  Filled 2016-12-15: qty 2.5

## 2016-12-15 MED ORDER — AMLODIPINE BESYLATE 5 MG PO TABS
5.0000 mg | ORAL_TABLET | Freq: Every day | ORAL | Status: DC
  Administered 2016-12-16: 5 mg via ORAL
  Filled 2016-12-15: qty 1

## 2016-12-15 MED ORDER — TIMOLOL MALEATE 0.5 % OP SOLN
1.0000 [drp] | Freq: Two times a day (BID) | OPHTHALMIC | Status: DC
  Administered 2016-12-15 – 2016-12-16 (×2): 1 [drp] via OPHTHALMIC
  Filled 2016-12-15: qty 5

## 2016-12-15 MED ORDER — ONDANSETRON HCL 4 MG/2ML IJ SOLN: 4.0000 mg | Freq: Four times a day (QID) | INTRAMUSCULAR | Status: DC | PRN

## 2016-12-15 MED ORDER — ALPRAZOLAM 0.25 MG PO TABS
0.2500 mg | ORAL_TABLET | Freq: Every day | ORAL | Status: DC | PRN
  Administered 2016-12-15: 0.25 mg via ORAL
  Filled 2016-12-15: qty 1

## 2016-12-15 MED ORDER — HYDRALAZINE HCL 20 MG/ML IJ SOLN: 10.0000 mg | Freq: Four times a day (QID) | INTRAMUSCULAR | Status: DC | PRN

## 2016-12-15 MED ORDER — IOPAMIDOL (ISOVUE-300) INJECTION 61%
75.0000 mL | Freq: Once | INTRAVENOUS | Status: AC | PRN
  Administered 2016-12-15: 75 mL via INTRAVENOUS

## 2016-12-15 MED ORDER — ACETAMINOPHEN 325 MG PO TABS: 650.0000 mg | ORAL_TABLET | Freq: Four times a day (QID) | ORAL | Status: DC | PRN

## 2016-12-15 MED ORDER — TIMOLOL MALEATE 0.5 % OP SOLG: 1.0000 [drp] | Freq: Every day | OPHTHALMIC | Status: DC

## 2016-12-15 MED ORDER — DEXTROSE 5 % IV SOLN
1.0000 g | Freq: Three times a day (TID) | INTRAVENOUS | Status: DC
  Administered 2016-12-15 – 2016-12-16 (×2): 1 g via INTRAVENOUS
  Filled 2016-12-15 (×5): qty 10

## 2016-12-15 MED ORDER — SODIUM CHLORIDE 0.9% FLUSH
3.0000 mL | Freq: Two times a day (BID) | INTRAVENOUS | Status: DC
  Administered 2016-12-15 – 2016-12-16 (×2): 3 mL via INTRAVENOUS

## 2016-12-15 MED ORDER — SODIUM CHLORIDE 0.9% FLUSH: 3.0000 mL | INTRAVENOUS | Status: DC | PRN

## 2016-12-15 MED ORDER — SODIUM CHLORIDE 0.9 % IV SOLN: 250.0000 mL | INTRAVENOUS | Status: DC | PRN

## 2016-12-15 MED ORDER — OLOPATADINE HCL 0.1 % OP SOLN
1.0000 [drp] | Freq: Two times a day (BID) | OPHTHALMIC | Status: DC
  Administered 2016-12-15 – 2016-12-16 (×2): 1 [drp] via OPHTHALMIC
  Filled 2016-12-15: qty 5

## 2016-12-15 MED ORDER — HYDROCODONE-ACETAMINOPHEN 5-325 MG PO TABS
1.0000 | ORAL_TABLET | ORAL | Status: DC | PRN
  Administered 2016-12-16: 1 via ORAL
  Filled 2016-12-15: qty 1

## 2016-12-15 MED ORDER — FLUTICASONE PROPIONATE 50 MCG/ACT NA SUSP
2.0000 | Freq: Every day | NASAL | Status: DC | PRN
  Filled 2016-12-15: qty 16

## 2016-12-15 SURGICAL SUPPLY — 24 items
BNDG GAUZE 1X2.1 STRL (MISCELLANEOUS) ×2 IMPLANT
CAST PADDING 2X4YD ST 30245 (MISCELLANEOUS) ×1
CHLORAPREP W/TINT 26ML (MISCELLANEOUS) ×2 IMPLANT
CUFF TOURN 18 STER (MISCELLANEOUS) IMPLANT
DRAPE FLUOR MINI C-ARM 54X84 (DRAPES) ×2 IMPLANT
ELECT REM PT RETURN 9FT ADLT (ELECTROSURGICAL) ×2
ELECTRODE REM PT RTRN 9FT ADLT (ELECTROSURGICAL) ×1 IMPLANT
GAUZE PETRO XEROFOAM 1X8 (MISCELLANEOUS) ×2 IMPLANT
GAUZE SPONGE 4X4 12PLY STRL (GAUZE/BANDAGES/DRESSINGS) ×2 IMPLANT
GAUZE SPONGE NON-WVN 2X2 STRL (MISCELLANEOUS) ×1 IMPLANT
GLOVE SURG SYN 9.0  PF PI (GLOVE) ×1
GLOVE SURG SYN 9.0 PF PI (GLOVE) ×1 IMPLANT
GOWN SRG 2XL LVL 4 RGLN SLV (GOWNS) ×1 IMPLANT
GOWN STRL NON-REIN 2XL LVL4 (GOWNS) ×1
GOWN STRL REUS W/ TWL LRG LVL3 (GOWN DISPOSABLE) ×1 IMPLANT
GOWN STRL REUS W/TWL LRG LVL3 (GOWN DISPOSABLE) ×1
KIT RM TURNOVER STRD PROC AR (KITS) ×2 IMPLANT
NS IRRIG 500ML POUR BTL (IV SOLUTION) ×2 IMPLANT
PACK EXTREMITY ARMC (MISCELLANEOUS) ×2 IMPLANT
PAD PREP 24X41 OB/GYN DISP (PERSONAL CARE ITEMS) ×2 IMPLANT
PADDING CAST COTTON 2X4 ST (MISCELLANEOUS) ×1 IMPLANT
SPONGE VERSALON 2X2 STRL (MISCELLANEOUS) ×1
SUT ETHIBOND 4-0 (SUTURE) ×2 IMPLANT
SUT ETHILON 5-0 FS-2 18 BLK (SUTURE) IMPLANT

## 2016-12-15 NOTE — ED Provider Notes (Signed)
[facility] Emergency Department Provider Note   ____________________________________________   First MD Initiated Contact with Patient 12/15/16 1117     (approximate)  I have reviewed the triage vital signs and the nursing notes.   HISTORY  Chief Complaint Fall    HPI Sarah [name] is a 83 y.o. female Who reports she was going to the mailbox and tripped and fell. She hit her head and her left hand. She has a blackened right eye and bruise swollen and tender left from all the way down to the wrist. She has no other injuries did not pass out and feels fine otherwise.  Past Medical History:  Diagnosis Date  . Arthritis   . Depression   . Glaucoma   . Hypercholesterolemia   . Hypertension   . Subclinical hypothyroidism     Patient Active Problem List   Diagnosis Date Noted  . Respiratory infection 06/08/2016  . Dysphagia 09/07/2014  . URI (upper respiratory infection) 05/26/2014  . Hallucinations 05/26/2014  . Abnormal chest CT 07/24/2012  . Hypertension 11/03/2011  . Hyperglycemia 11/03/2011  . Subclinical hypothyroidism 11/03/2011  . Hypercholesterolemia 11/03/2011  . Depression 11/03/2011    Past Surgical History:  Procedure Laterality Date  . ABDOMINAL HYSTERECTOMY  1978  . HIP FRACTURE SURGERY Right     Prior to Admission medications   Medication Sig Start Date End Date Taking? Authorizing Provider  ALPRAZolam Prudy Feeler(XANAX) 0.5 MG tablet Take 0.25-0.5 mg by mouth daily as needed for anxiety.  04/30/14   [provider]  amLODipine (NORVASC) 5 MG tablet take 1 tablet by mouth once daily 06/28/16   Dale DurhamScott, Charlene, MD  fluticasone [facility](FLONASE) 50 MCG/ACT nasal spray Place 2 sprays into both nostrils daily. 01/09/13   Dale DurhamScott, Charlene, MD  latanoprost (XALATAN) 0.005 % ophthalmic solution  05/22/14   [provider]  Multiple Vitamins-Minerals (CENTRUM SILVER PO) Take 1 tablet by mouth daily.    [provider]    Olopatadine HCl 0.2 % SOLN 1 drop to each eye daily. 06/08/16   Tommie Samsook, Jayce G, DO  ondansetron (ZOFRAN ODT) 4 MG disintegrating tablet Take 1 tablet (4 mg total) by mouth every 8 (eight) hours as needed for nausea or vomiting. 11/20/15   Pricilla LovelessGoldston, Scott, MD  timolol (TIMOPTIC-XR) 0.5 % ophthalmic gel-forming  05/09/14   [provider]  venlafaxine XR (EFFEXOR-XR) 150 MG 24 hr capsule Take 150 mg by mouth 2 (two) times daily.     [provider]    Allergies Codeine and Morphine and related  Family History  Problem Relation Age of Onset  . Alcohol abuse Mother   . Arthritis Mother   . Hyperlipidemia Mother   . Hypertension Mother   . Alcohol abuse Father   . Arthritis Father   . Hyperlipidemia Father   . Hypertension Father     Social History Social History   Tobacco Use  . Smoking status: Never Smoker  . Smokeless tobacco: Never Used  Substance Use Topics  . Alcohol use: No    Alcohol/week: 0.0 oz  . Drug use: No    Review of Systems  Constitutional: No fever/chills Eyes: No visual changes. ENT: No sore throat. Cardiovascular: Denies chest pain. Respiratory: Denies shortness of breath. Gastrointestinal: No abdominal pain.  No nausea, no vomiting.  No diarrhea.  No constipation. Genitourinary: Negative for dysuria. Musculoskeletal: Negative for back pain. Skin: Negative for rash. Neurological: Negative for headaches, focal weakness  ____________________________________________   PHYSICAL EXAM:  VITAL SIGNS: ED Triage Vitals  Enc Vitals Group     BP 12/15/16 1055 (!) 165/88     Pulse Rate 12/15/16 1055 88     Resp 12/15/16 1055 20     Temp 12/15/16 1055 98 F (36.7 C)     Temp Source 12/15/16 1055 Oral     SpO2 12/15/16 1055 98 %     Weight 12/15/16 1056 135 lb (61.2 kg)     Height --      Head Circumference --      Peak Flow --      Pain Score 12/15/16 1055 4     Pain Loc --      Pain Edu? --      Excl. in GC? --      Constitutional: Alert and oriented. Well appearing and in no acute distress. Eyes: Conjunctivae are normal.  Head: Atraumatic. Nose: No congestion/rhinnorhea. Mouth/Throat: Mucous membranes are moist.  Oropharynx non-erythematous. Neck: No stridor Cardiovascular: Normal rate, regular rhythm. Grossly normal heart sounds.  Good peripheral circulation. Respiratory: Normal respiratory effort.  No retractions. Lungs CTAB. Gastrointestinal: Soft and nontender. No distention. No abdominal bruits. No CVA tenderness. Musculoskeletal: No lower extremity tenderness nor edema.spine is not tender to palpation percussion ribs are nontender palpation percussion in the left thumb is swollen and bruised and tender there is a small skin tear of the tip of the thumb which is been replaced, Band-Aid and a small abrasion over the webspace between the thumb and index finger on the dorsum of the hand that is the same. The MCP joint is swollen and tender and the area at the base of the thumb is swollen and tender in the whole area is bruised thumb and the anterior digital space and the wrist on the radial side. Neurologic:  Normal speech and language. No gross focal neurologic deficits are appreciated.  Skin:  Skin is warm, dry and intact. No rash noted. Psychiatric: Mood and affect are normal. Speech and behavior are normal.  ____________________________________________   LABS (all labs ordered are listed, but only abnormal results are displayed)  Labs Reviewed - No data to display ____________________________________________  EKG   ____________________________________________  RADIOLOGY   ____________________________________________   PROCEDURES  Procedure(s) performed:  Procedures  Critical Care performed: critical care time one and a half hours. This includes discussing the patient and believe it's now 3 times with neurosurgery twice with the hospitalist twice with orthopedics and once with  ENT for clearing the neck mass. II also spoke with the patient several times patient's caregiver and the patient's daughter on the phone.of note it also took me quite a while to find a diagnosis which I could put in for the thumb fracture which is still not the correct diagnosis.  anesthesia asked to consult ENT to evaluate the neck mass prior to anesthesia. I have discussed the patient with ENT, ENT will come here as soon as he possibly can.  ____________________________________________   INITIAL IMPRESSION / ASSESSMENT AND PLAN / ED COURSE  old records including care review her chart review were examined. Patient has a diagnosis of arthritis depression blood, hypercholesterol hypertension and subclinical hypothyroidism. She has had hip fracture surgery hips as I noted above are nontender. She also reportedly has a history of dementia.    patient caregiver feel its shots are up to date so we will not give her tetanus shot   ____________________________________________   FINAL CLINICAL IMPRESSION(S) / ED DIAGNOSES  Final diagnoses:  None  correct  diagnosis is open displaced fracture of the metacarpal of the left thumb initial encounter I cannot find this in the computer  ED Discharge Orders    None       Note:  This document was prepared using Dragon voice recognition software and may include unintentional dictation errors.    Arnaldo NatalMalinda, Paul F, MD 12/15/16 1440

## 2016-12-15 NOTE — ED Notes (Addendum)
[facility] unwitnessed fall x1 day , bruising and abrasions noted, pt with caregiver

## 2016-12-15 NOTE — ED Notes (Signed)
Pt transported to room 144 

## 2016-12-15 NOTE — Consult Note (Signed)
Referring Physician:  No referring provider defined for this encounter.  Primary Physician:  Dale DurhamScott, Charlene, MD  Chief Complaint:  Fall  History of Present Illness: Vernell BarrierGeraldine K Scarbro is a 83 y.o. female who presents after a fall resulting in subarachnoid hemorrhage. Neurosurgery was consulted for further evaluation. Unable to explain circumstance of fall. Patient complains of frontal center headache, denies vision changes. Per family there has been no change in baseline mental status.   Review of Systems:  A 10 point review of systems is negative, except for the pertinent positives and negatives detailed in the HPI.  Past Medical History: Past Medical History:  Diagnosis Date  . Arthritis   . Depression   . Glaucoma   . Hypercholesterolemia   . Hypertension   . Subclinical hypothyroidism     Past Surgical History: Past Surgical History:  Procedure Laterality Date  . ABDOMINAL HYSTERECTOMY  1978  . HIP FRACTURE SURGERY Right     Allergies: Allergies as of 12/15/2016 - Review Complete 12/15/2016  Allergen Reaction Noted  . Codeine Nausea And Vomiting 11/03/2011  . Morphine and related Other (See Comments) 05/27/2014    Medications:  Current Facility-Administered Medications:  .  0.9 %  sodium chloride infusion, 250 mL, Intravenous, PRN, Auburn BilberryPatel, Shreyang, MD .  acetaminophen (TYLENOL) tablet 650 mg, 650 mg, Oral, Q6H PRN **OR** acetaminophen (TYLENOL) suppository 650 mg, 650 mg, Rectal, Q6H PRN, Auburn BilberryPatel, Shreyang, MD .  ALPRAZolam Prudy Feeler(XANAX) tablet 0.25-0.5 mg, 0.25-0.5 mg, Oral, Daily PRN, Auburn BilberryPatel, Shreyang, MD .  Melene Muller[START ON 12/16/2016] amLODipine (NORVASC) tablet 5 mg, 5 mg, Oral, Daily, Auburn BilberryPatel, Shreyang, MD .  ceFAZolin (ANCEF) 1 g in dextrose 5 % 50 mL IVPB, 1 g, Intravenous, Q8H, Kennedy BuckerMenz, Michael, MD .  fluticasone (FLONASE) 50 MCG/ACT nasal spray 2 spray, 2 spray, Each Nare, Daily PRN, Auburn BilberryPatel, Shreyang, MD .  hydrALAZINE (APRESOLINE) injection 10 mg, 10 mg, Intravenous, Q6H  PRN, Auburn BilberryPatel, Shreyang, MD .  HYDROcodone-acetaminophen (NORCO/VICODIN) 5-325 MG per tablet 1-2 tablet, 1-2 tablet, Oral, Q4H PRN, Auburn BilberryPatel, Shreyang, MD .  latanoprost (XALATAN) 0.005 % ophthalmic solution 1 drop, 1 drop, Both Eyes, QHS, Auburn BilberryPatel, Shreyang, MD .  multivitamin with minerals tablet 1 tablet, 1 tablet, Oral, Daily, Auburn BilberryPatel, Shreyang, MD .  olopatadine (PATANOL) 0.1 % ophthalmic solution 1 drop, 1 drop, Both Eyes, BID, Auburn BilberryPatel, Shreyang, MD .  ondansetron (ZOFRAN) tablet 4 mg, 4 mg, Oral, Q6H PRN **OR** ondansetron (ZOFRAN) injection 4 mg, 4 mg, Intravenous, Q6H PRN, Auburn BilberryPatel, Shreyang, MD .  sodium chloride flush (NS) 0.9 % injection 3 mL, 3 mL, Intravenous, Q12H, Patel, Shreyang, MD .  sodium chloride flush (NS) 0.9 % injection 3 mL, 3 mL, Intravenous, PRN, Auburn BilberryPatel, Shreyang, MD .  timolol (TIMOPTIC) 0.5 % ophthalmic solution 1 drop, 1 drop, Both Eyes, BID, Auburn BilberryPatel, Shreyang, MD   Social History: Social History   Tobacco Use  . Smoking status: Never Smoker  . Smokeless tobacco: Never Used  Substance Use Topics  . Alcohol use: No    Alcohol/week: 0.0 oz  . Drug use: No    Family Medical History: Family History  Problem Relation Age of Onset  . Alcohol abuse Mother   . Arthritis Mother   . Hyperlipidemia Mother   . Hypertension Mother   . Alcohol abuse Father   . Arthritis Father   . Hyperlipidemia Father   . Hypertension Father     Physical Examination: Vitals:   12/15/16 1730 12/15/16 1951  BP: (!) 160/73 139/66  Pulse: (!) 104 100  Resp:  18  Temp: 99.5 F (37.5 C) 99.2 F (37.3 C)  SpO2: 100% 98%     General: Patient is well developed, well nourished, calm, collected, and in no apparent distress.  Psychiatric: Patient is non-anxious.  Head:  Pupils equal, round, and reactive to light.  Respiratory: Patient is breathing without any difficulty.  Skin:   Right eye ecchymosis.   NEUROLOGICAL:  General: In no acute distress.   Awake, alert, oriented to person,  place, and time.  Pupils equal round and reactive to light.  Facial tone is symmetric. There is no pronator drift.  Strength: Side Biceps Triceps Deltoid Interossei Grip Wrist Ext. Wrist Flex.  R 5 5 5 5 5 5 5   L 5 5 5 5 5 5 5    Side Iliopsoas Quads Hamstring PF DF EHL  R 5 5 5 5 5 5   L 5 5 5 5 5 5    Reflexes are 2+ and symmetric at the biceps, triceps, brachioradialis, patella and achilles.   Bilateral upper and lower extremity sensation is intact to light touch and pin prick.    Imaging: EXAM: CT HEAD WITHOUT CONTRAST  CT CERVICAL SPINE WITHOUT CONTRAST  TECHNIQUE: Multidetector CT imaging of the head and cervical spine was performed following the standard protocol without intravenous contrast. Multiplanar CT image reconstructions of the cervical spine were also generated.  COMPARISON:  None.  FINDINGS: CT HEAD FINDINGS  Brain: Moderate atrophy. Negative for hydrocephalus. Periventricular white matter hypodensity consistent with chronic microvascular ischemia. No acute infarct. Negative for mass lesion.  Mild subarachnoid hemorrhage bilaterally right greater than left. 5 mm thick interhemispheric hygroma left frontal region. No midline shift or hydrocephalus.  Vascular: Negative for hyperdense vessel.  Skull: Negative for fracture.  Sinuses/Orbits: Mucosal thickening sphenoid sinus. Bilateral lens replacement. No orbital mass.  Other: None  CT CERVICAL SPINE FINDINGS  Alignment: Mild anterolisthesis C2-3 C3-4, and C4-5.  Skull base and vertebrae: Negative for fracture  Soft tissues and spinal canal: Atherosclerotic calcification the carotid.  Large mass in the left lower neck extending into the mediastinum. The mass measures 6 x 4 cm. This has heterogeneous density and appears to be of thyroid origin. This could be a goiter. Less likely neoplasm. This is displacing the esophagus significantly to the right. No adenopathy in the neck  elsewhere.  Disc levels: Advanced multilevel disc and facet degeneration throughout the cervical spine. Advanced degenerative changes C1-2.  Upper chest: Lung apices clear  Other: None  IMPRESSION: Mild bilateral subarachnoid hemorrhage. No shift of the midline structures.  Atrophy with chronic microvascular ischemia.  No acute infarct.  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  These results were called by telephone at the time of interpretation on 12/15/2016 at 12:07 pm to Dr. Dorothea GlassmanPAUL MALINDA , who verbally acknowledged these results.   Electronically Signed   By: Marlan Palauharles  Clark M.D.   On: 12/15/2016 12:07  Assessment and Plan: Ms. Lucas MallowRivenbark is a pleasant 83 y.o. female with mild bilateral subarachnoid hemorrhages. No changes in mental status. Neuro exam unremarkable for deficits. Per Dr. Myer HaffYarbrough note from earlier today, surgical intervention and repeat imaging not indicated at this time. Follow up in clinic 4 weeks after discharge.   Ivar DrapeAmanda Zeric Baranowski, PA-C Dept. of Neurosurgery

## 2016-12-15 NOTE — Consult Note (Signed)
I have reviewed the imaging for this patient.  Briefly, the patient is a 83 yo female who had a same level fall yesterday evening.  She is at her baseline neurological status, and has no focal neurological deficits per discussion with ER attending Dr. Cinda Quest.  The CT scan for this patient shows a minute amount of subarachnoid hemorrhage overlying the R and L frontal lobes.  Each of these areas of hyperdensity is very small and creates no mass effect or midline shift.  There is ample atrophy of the brain, as expected for age.    - There is no indication for surgical intervention or further imaging unless the patient has a change in mental status  - OK to proceed to OR for orthopedic surgery.  If there is concern about putting the patient under general anesthesia, local with MAC or blocks could be considered.    - We will see patient and follow up 4 weeks after discharge.  Meade Maw MD Neurosurgery

## 2016-12-15 NOTE — Anesthesia Preprocedure Evaluation (Deleted)
Anesthesia Evaluation  Patient identified by MRN, date of birth, ID band Patient awake    Reviewed: Allergy & Precautions, H&P , NPO status , Patient's Chart, lab work & pertinent test results, reviewed documented beta blocker date and time   Airway Mallampati: II  TM Distance: >3 FB Neck ROM: full    Dental no notable dental hx. (+) Teeth Intact   Pulmonary neg pulmonary ROS,    Pulmonary exam normal breath sounds clear to auscultation       Cardiovascular Exercise Tolerance: Poor hypertension, On Medications negative cardio ROS   Rhythm:regular Rate:Normal     Neuro/Psych PSYCHIATRIC DISORDERS negative neurological ROS  negative psych ROS   GI/Hepatic negative GI ROS, Neg liver ROS,   Endo/Other  negative endocrine ROSdiabetesHypothyroidism   Renal/GU      Musculoskeletal   Abdominal   Peds  Hematology negative hematology ROS (+)   Anesthesia Other Findings   Reproductive/Obstetrics negative OB ROS                             Anesthesia Physical Anesthesia Plan  ASA: III and emergent  Anesthesia Plan: MAC and Bier Block and Bier Block-Lidocaine Only   Post-op Pain Management:    Induction:   PONV Risk Score and Plan: 3  Airway Management Planned:   Additional Equipment:   Intra-op Plan:   Post-operative Plan:   Informed Consent: I have reviewed the patients History and Physical, chart, labs and discussed the procedure including the risks, benefits and alternatives for the proposed anesthesia with the patient or authorized representative who has indicated his/her understanding and acceptance.     Plan Discussed with: CRNA  Anesthesia Plan Comments: (Complicated situation with emergent need per orthopoedics to proceed for surgical IandD and repair.  NS and ENT consultation reviewed  Will plan on MAC with peripheral block in this very sick patient.  JA)         Anesthesia Quick Evaluation

## 2016-12-15 NOTE — H&P (Addendum)
[facility] - [place] at [facility]   PATIENT NAME: Sarah [name]    MR#:  [id number]  DATE OF BIRTH:  Oct 08, 1923  DATE OF ADMISSION:  12/15/2016  PRIMARY CARE PHYSICIAN: Dale [place], MD   REQUESTING/REFERRING PHYSICIAN: Marge Duncans MD CHIEF COMPLAINT:   Chief Complaint  Patient presents with  . Fall    HISTORY OF PRESENT ILLNESS: Deyona Soza  is a 83 y.o. female with a known history of osteoarthritis, depression, glaucoma, hypercholesteremia, essential hypertension who sustained a fall yesterday at home.  Patient is a poor historian unable to tell me the exact circumstances of her fall.  However she was walking to the mailbox and apparently tripped and fell.  Patient hit her head and her left hand.  Patient came to the emergency room and had a workup showed that she had bilateral subarachnoid hemorrhage.  The ER physician contacted the on-call neurosurgeon who reported that these were very mild that did not need any intervention.  Patient also is noted to have Comminuted, displaced, and angulated fracture of the distal first metacarpal, likely extending into the thumb MCP joint.  Patient was seen by the orthopedic surgeon who reports that patient will need to go to the OR later.  Patient otherwise denies any chest pain shortness of breath. PAST MEDICAL HISTORY:   Past Medical History:  Diagnosis Date  . Arthritis   . Depression   . Glaucoma   . Hypercholesterolemia   . Hypertension   . Subclinical hypothyroidism     PAST SURGICAL HISTORY:  Past Surgical History:  Procedure Laterality Date  . ABDOMINAL HYSTERECTOMY  1978  . HIP FRACTURE SURGERY Right     SOCIAL HISTORY:  Social History   Tobacco Use  . Smoking status: Never Smoker  . Smokeless tobacco: Never Used  Substance Use Topics  . Alcohol use: No    Alcohol/week: 0.0 oz    FAMILY HISTORY:  Family History  Problem Relation Age of Onset  . Alcohol abuse Mother   . Arthritis  Mother   . Hyperlipidemia Mother   . Hypertension Mother   . Alcohol abuse Father   . Arthritis Father   . Hyperlipidemia Father   . Hypertension Father     DRUG ALLERGIES:  Allergies  Allergen Reactions  . Codeine Nausea And Vomiting  . Morphine And Related Other (See Comments)    Hallucinations    REVIEW OF SYSTEMS:   CONSTITUTIONAL:  Patient is a very poor historian  MEDICATIONS AT HOME:  Prior to Admission medications   Medication Sig Start Date End Date Taking? Authorizing Provider  ALPRAZolam Prudy Feeler) 0.5 MG tablet Take 0.25-0.5 mg by mouth daily as needed for anxiety.  04/30/14  Yes [provider]  amLODipine (NORVASC) 5 MG tablet take 1 tablet by mouth once daily 06/28/16  Yes Dale [place], MD  fluticasone (FLONASE) 50 MCG/ACT nasal spray Place 2 sprays into both nostrils daily. 01/09/13  Yes Dale [place], MD  latanoprost (XALATAN) 0.005 % ophthalmic solution Place 1 drop into both eyes at bedtime.  05/22/14  Yes [provider]  Multiple Vitamins-Minerals (CENTRUM SILVER PO) Take 1 tablet by mouth daily.   Yes [provider]  Olopatadine HCl 0.2 % SOLN 1 drop to each eye daily. 06/08/16  Yes Cook, Jayce G, DO  timolol (TIMOPTIC-XR) 0.5 % ophthalmic gel-forming Place 1 drop into both eyes daily.  05/09/14  Yes [provider]  ondansetron (ZOFRAN ODT) 4 MG disintegrating tablet Take 1 tablet (4  mg total) by mouth every 8 (eight) hours as needed for nausea or vomiting. Patient not taking: Reported on 12/15/2016 11/20/15   Pricilla LovelessGoldston, Scott, MD      PHYSICAL EXAMINATION:   VITAL SIGNS: Blood pressure 118/61, pulse (!) 104, temperature 98 F (36.7 C), temperature source Oral, resp. rate 20, weight 135 lb (61.2 kg), SpO2 99 %.  GENERAL:  83 y.o.-year-old patient lying in the bed with no acute distress.  EYES: Pupils equal, round, reactive to light and accommodation. No scleral icterus. Extraocular muscles intact.  She has bruising  around her eyes HEENT: Head atraumatic, normocephalic. Oropharynx and nasopharynx clear.  NECK:  Supple, no jugular venous distention. No thyroid enlargement, no tenderness.  LUNGS: Normal breath sounds bilaterally, no wheezing, rales,rhonchi or crepitation. No use of accessory muscles of respiration.  CARDIOVASCULAR: S1, S2 normal. No murmurs, rubs, or gallops.  ABDOMEN: Soft, nontender, nondistended. Bowel sounds present. No organomegaly or mass.  EXTREMITIES: Left hand around the thumb region swollen NEUROLOGIC: Cranial nerves II through XII are intact. Muscle strength 5/5 in all extremities. Sensation intact. Gait not checked.  PSYCHIATRIC: The patient is alert and oriented x 3.  SKIN: No obvious rash, lesion, or ulcer.   LABORATORY PANEL:   CBC No results for input(s): WBC, HGB, HCT, PLT, MCV, MCH, MCHC, RDW, LYMPHSABS, MONOABS, EOSABS, BASOSABS, BANDABS in the last 168 hours.  Invalid input(s): NEUTRABS, BANDSABD ------------------------------------------------------------------------------------------------------------------  Chemistries  No results for input(s): NA, K, CL, CO2, GLUCOSE, BUN, CREATININE, CALCIUM, MG, AST, ALT, ALKPHOS, BILITOT in the last 168 hours.  Invalid input(s): GFRCGP ------------------------------------------------------------------------------------------------------------------ CrCl cannot be calculated (Patient's most recent lab result is older than the maximum 21 days allowed.). ------------------------------------------------------------------------------------------------------------------ No results for input(s): TSH, T4TOTAL, T3FREE, THYROIDAB in the last 72 hours.  Invalid input(s): FREET3   Coagulation profile No results for input(s): INR, PROTIME in the last 168 hours. ------------------------------------------------------------------------------------------------------------------- No results for input(s): DDIMER in the last 72  hours. -------------------------------------------------------------------------------------------------------------------  Cardiac Enzymes No results for input(s): CKMB, TROPONINI, MYOGLOBIN in the last 168 hours.  Invalid input(s): CK ------------------------------------------------------------------------------------------------------------------ Invalid input(s): POCBNP  ---------------------------------------------------------------------------------------------------------------  Urinalysis    Component Value Date/Time   COLORURINE YELLOW 11/20/2015 1447   APPEARANCEUR CLEAR 11/20/2015 1447   APPEARANCEUR Clear 05/05/2012 1551   LABSPEC 1.021 11/20/2015 1447   LABSPEC 1.008 05/05/2012 1551   PHURINE 6.5 11/20/2015 1447   GLUCOSEU NEGATIVE 11/20/2015 1447   GLUCOSEU Negative 05/05/2012 1551   HGBUR NEGATIVE 11/20/2015 1447   BILIRUBINUR NEGATIVE 11/20/2015 1447   BILIRUBINUR Negative 05/05/2012 1551   KETONESUR 15 (A) 11/20/2015 1447   PROTEINUR 30 (A) 11/20/2015 1447   NITRITE NEGATIVE 11/20/2015 1447   LEUKOCYTESUR SMALL (A) 11/20/2015 1447   LEUKOCYTESUR 2+ 05/05/2012 1551     RADIOLOGY: Ct Head Wo Contrast  Result Date: 12/15/2016 CLINICAL DATA:  C-spine trauma, high clinical risk EXAM: CT HEAD WITHOUT CONTRAST CT CERVICAL SPINE WITHOUT CONTRAST TECHNIQUE: Multidetector CT imaging of the head and cervical spine was performed following the standard protocol without intravenous contrast. Multiplanar CT image reconstructions of the cervical spine were also generated. COMPARISON:  None. FINDINGS: CT HEAD FINDINGS Brain: Moderate atrophy. Negative for hydrocephalus. Periventricular white matter hypodensity consistent with chronic microvascular ischemia. No acute infarct. Negative for mass lesion. Mild subarachnoid hemorrhage bilaterally right greater than left. 5 mm thick interhemispheric hygroma left frontal region. No midline shift or hydrocephalus. Vascular: Negative for  hyperdense vessel. Skull: Negative for fracture. Sinuses/Orbits: Mucosal thickening sphenoid sinus. Bilateral lens replacement. No orbital mass. Other:  None CT CERVICAL SPINE FINDINGS Alignment: Mild anterolisthesis C2-3 C3-4, and C4-5. Skull base and vertebrae: Negative for fracture Soft tissues and spinal canal: Atherosclerotic calcification the carotid. Large mass in the left lower neck extending into the mediastinum. The mass measures 6 x 4 cm. This has heterogeneous density and appears to be of thyroid origin. This could be a goiter. Less likely neoplasm. This is displacing the esophagus significantly to the right. No adenopathy in the neck elsewhere. Disc levels: Advanced multilevel disc and facet degeneration throughout the cervical spine. Advanced degenerative changes C1-2. Upper chest: Lung apices clear Other: None IMPRESSION: Mild bilateral subarachnoid hemorrhage. No shift of the midline structures. Atrophy with chronic microvascular ischemia.  No acute infarct. 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 These results were called by telephone at the time of interpretation on 12/15/2016 at 12:07 pm to Dr. Dorothea Glassman , who verbally acknowledged these results. Electronically Signed   By: Marlan Palau M.D.   On: 12/15/2016 12:07   Ct Cervical Spine Wo Contrast  Result Date: 12/15/2016 CLINICAL DATA:  C-spine trauma, high clinical risk EXAM: CT HEAD WITHOUT CONTRAST CT CERVICAL SPINE WITHOUT CONTRAST TECHNIQUE: Multidetector CT imaging of the head and cervical spine was performed following the standard protocol without intravenous contrast. Multiplanar CT image reconstructions of the cervical spine were also generated. COMPARISON:  None. FINDINGS: CT HEAD FINDINGS Brain: Moderate atrophy. Negative for hydrocephalus. Periventricular white matter  hypodensity consistent with chronic microvascular ischemia. No acute infarct. Negative for mass lesion. Mild subarachnoid hemorrhage bilaterally right greater than left. 5 mm thick interhemispheric hygroma left frontal region. No midline shift or hydrocephalus. Vascular: Negative for hyperdense vessel. Skull: Negative for fracture. Sinuses/Orbits: Mucosal thickening sphenoid sinus. Bilateral lens replacement. No orbital mass. Other: None CT CERVICAL SPINE FINDINGS Alignment: Mild anterolisthesis C2-3 C3-4, and C4-5. Skull base and vertebrae: Negative for fracture Soft tissues and spinal canal: Atherosclerotic calcification the carotid. Large mass in the left lower neck extending into the mediastinum. The mass measures 6 x 4 cm. This has heterogeneous density and appears to be of thyroid origin. This could be a goiter. Less likely neoplasm. This is displacing the esophagus significantly to the right. No adenopathy in the neck elsewhere. Disc levels: Advanced multilevel disc and facet degeneration throughout the cervical spine. Advanced degenerative changes C1-2. Upper chest: Lung apices clear Other: None IMPRESSION: Mild bilateral subarachnoid hemorrhage. No shift of the midline structures. Atrophy with chronic microvascular ischemia.  No acute infarct. 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 These results were called by telephone at the time of interpretation on 12/15/2016 at 12:07 pm to Dr. Dorothea Glassman , who verbally acknowledged these results. Electronically Signed   By: Marlan Palau M.D.   On: 12/15/2016 12:07   Dg Hand Complete Left  Result Date: 12/15/2016 CLINICAL DATA:  Fall. EXAM: LEFT HAND - COMPLETE 3+ VIEW COMPARISON:  Left wrist x-rays dated October 15, 2013. FINDINGS: There is a comminuted, displaced fracture of the distal first  metacarpal, likely extending into the thumb MCP joint. There is approximately 6 mm of dorsal displacement of the distal fragment with prominent ulnar angulation. No other fracture identified. Old ulnar styloid fracture. Severe degenerative changes involving the first [facility]  joint and radiocarpal joint are again noted. Scattered mild to moderate degenerative changes involving the MCP and IP joints, also similar to prior study. Diffuse soft tissue swelling about the base of the thumb. IMPRESSION: 1. Comminuted, displaced, and angulated fracture of the distal first metacarpal, likely extending into the thumb MCP joint. Prominent surrounding soft tissue swelling at the base of the thumb. Electronically Signed   By: Obie DredgeWilliam T Derry M.D.   On: 12/15/2016 12:30    EKG: Orders placed or performed during the hospital encounter of 12/15/16  . ED EKG  . ED EKG    IMPRESSION AND PLAN: Patient is a 83 year old status post fall  1.  Bilateral subarachnoid hemorrhage patient currently asymptomatic neurosurgery recommends no intervention They will come see the patient  2.  Left thumb fracture plan for or later today Patient is is cleared for surgery no cardiopulmonary workup needed  3.  Essential hypertension continue amlodipine  4.  Glaucoma continue eyedrops  5.  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 -Monitor outpatient follow-up     All the records are reviewed and case discussed with ED provider. Management plans discussed with the patient, family and they are in agreement.  CODE STATUS: Code Status History    This patient does not have a recorded code status. Please follow your organizational policy for patients in this situation.       TOTAL TIME TAKING CARE OF THIS PATIENT: 55 minutes.    Auburn BilberryPATEL, Einer Meals M.D on 12/15/2016 at 2:47 PM  Between 7am to 6pm - Pager - [phone]  After  6pm go to [email or web address] - password [email or web address] [facility]  [facility]  Office  [phone]  CC: Primary care physician; Dale DurhamScott, Charlene, MD

## 2016-12-15 NOTE — ED Notes (Signed)
Pt upset about staying in hospital for thanksgiving but agrees to being transferred for ortho surgery. Pt laid down on stretcher, given pillow and warm blanket, eyes closed, even and nonlabored respirations noted.

## 2016-12-15 NOTE — Consult Note (Signed)
83 year old suffered a fall yesterday at home. She came in today with her caregiver and was found to have some fracture with skin puncture with some erythema and subarachnoid hemorrhage is being admitted for treatment of these conditions She does suffer from some confusion and isn't clear exactly what caused her fall. She has markedly swelling with some erythema over the base of the thumb and puncture wound is present in the area at the level of the fracture. She has intact sensation to the thumb is able flex extend the IP joint with severe pain X-rays revealed comminuted distal shaft fracture of the first metacarpal  Impression is grade 1 open metacarpal fracture of the thumb, more than 8 hours old Plan is to bring her to surgery for irrigation and debridement of that  puncture wound and allowing it to drain with closed reduction of the metacarpal and pinning, perioperative antibiotics

## 2016-12-15 NOTE — ED Notes (Signed)
Attempted to call report x 2, No RN available to take report

## 2016-12-15 NOTE — Consult Note (Addendum)
Sarah [name], Sarah [name] [id number] 01-16-1924 Riley Nearing, MD  Reason for Consult: Thyroid mass Requesting Physician: Dustin Flock, MD , Marlowe Alt, MD Consulting Physician: Riley Nearing, MD  HPI: This 83 y.o. year old female was admitted on 12/15/2016 for Neck mass [R22.1] Subarachnoid bleed ([place]) [I60.9] Open displaced fracture of phalanx of left thumb, unspecified phalanx, initial encounter [S62.502B]. Anesthesia requested consultation regarding a large left neck and chest mass displacing the trachea. This appears to be a longstanding thyroid goiter which is known to the patient. She discussed this with Dr. Einar Pheasant in 2016 when it was noted on CT of the chest, and she has refused further evaluation. She appears to be relatively asymptomatic with no hoarseness and rare swallowing problems. She did have to go to the ER in the past when meat became lodged in the throat, but in general is able to eat solid foods, though with her dentures she tends to eat more of a soft diet. She also denies any hoarseness or difficulty breathing. Looking back at labs, her TSH has been consistently low, though T3 and T4 have been normal. Her PCP has been following this.  Medications:  Current Facility-Administered Medications  Medication Dose Route Frequency Provider Last Rate Last Dose  . 0.9 %  sodium chloride infusion  250 mL Intravenous PRN Dustin Flock, MD      . acetaminophen (TYLENOL) tablet 650 mg  650 mg Oral Q6H PRN Dustin Flock, MD       Or  . acetaminophen (TYLENOL) suppository 650 mg  650 mg Rectal Q6H PRN Dustin Flock, MD      . ALPRAZolam Duanne Moron) tablet 0.25-0.5 mg  0.25-0.5 mg Oral Daily PRN Dustin Flock, MD      . Derrill Memo ON 12/16/2016] amLODipine (NORVASC) tablet 5 mg  5 mg Oral Daily Dustin Flock, MD      . ceFAZolin (ANCEF) 1 g in dextrose 5 % 50 mL IVPB  1 g Intravenous Q8H Hessie Knows, MD      . fluticasone (FLONASE) 50 MCG/ACT nasal spray 2 spray  2 spray Each  Nare Daily PRN Dustin Flock, MD      . hydrALAZINE (APRESOLINE) injection 10 mg  10 mg Intravenous Q6H PRN Dustin Flock, MD      . HYDROcodone-acetaminophen (NORCO/VICODIN) 5-325 MG per tablet 1-2 tablet  1-2 tablet Oral Q4H PRN Dustin Flock, MD      . latanoprost (XALATAN) 0.005 % ophthalmic solution 1 drop  1 drop Both Eyes QHS Dustin Flock, MD      . multivitamin with minerals tablet 1 tablet  1 tablet Oral Daily Dustin Flock, MD      . olopatadine (PATANOL) 0.1 % ophthalmic solution 1 drop  1 drop Both Eyes BID Dustin Flock, MD      . ondansetron [facility]) tablet 4 mg  4 mg Oral Q6H PRN Dustin Flock, MD       Or  . ondansetron (ZOFRAN) injection 4 mg  4 mg Intravenous Q6H PRN Dustin Flock, MD      . sodium chloride flush (NS) 0.9 % injection 3 mL  3 mL Intravenous Q12H Dustin Flock, MD      . sodium chloride flush (NS) 0.9 % injection 3 mL  3 mL Intravenous PRN Dustin Flock, MD      . timolol (TIMOPTIC) 0.5 % ophthalmic solution 1 drop  1 drop Both Eyes BID Dustin Flock, MD      .  Medications Prior to Admission  Medication Sig  Dispense Refill  . ALPRAZolam (XANAX) 0.5 MG tablet Take 0.25-0.5 mg by mouth daily as needed for anxiety.   0  . amLODipine (NORVASC) 5 MG tablet take 1 tablet by mouth once daily 30 tablet 5  . fluticasone (FLONASE) 50 MCG/ACT nasal spray Place 2 sprays into both nostrils daily. 16 g 2  . latanoprost (XALATAN) 0.005 % ophthalmic solution Place 1 drop into both eyes at bedtime.   0  . Multiple Vitamins-Minerals (CENTRUM SILVER PO) Take 1 tablet by mouth daily.    . Olopatadine HCl 0.2 % SOLN 1 drop to each eye daily. 2.5 mL 0  . timolol (TIMOPTIC-XR) 0.5 % ophthalmic gel-forming Place 1 drop into both eyes daily.   0  . ondansetron (ZOFRAN ODT) 4 MG disintegrating tablet Take 1 tablet (4 mg total) by mouth every 8 (eight) hours as needed for nausea or vomiting. (Patient not taking: Reported on 12/15/2016) 10 tablet 0    Allergies:   Allergies  Allergen Reactions  . Codeine Nausea And Vomiting  . Morphine And Related Other (See Comments)    Hallucinations    PMH:  Past Medical History:  Diagnosis Date  . Arthritis   . Depression   . Glaucoma   . Hypercholesterolemia   . Hypertension   . Subclinical hypothyroidism     Fam Hx:  Family History  Problem Relation Age of Onset  . Alcohol abuse Mother   . Arthritis Mother   . Hyperlipidemia Mother   . Hypertension Mother   . Alcohol abuse Father   . Arthritis Father   . Hyperlipidemia Father   . Hypertension Father     Soc Hx:  Social History   Socioeconomic History  . Marital status: Widowed    Spouse name: Not on file  . Number of children: Not on file  . Years of education: Not on file  . Highest education level: Not on file  Social Needs  . Financial resource strain: Not on file  . Food insecurity - worry: Not on file  . Food insecurity - inability: Not on file  . Transportation needs - medical: Not on file  . Transportation needs - non-medical: Not on file  Occupational History  . Not on file  Tobacco Use  . Smoking status: Never Smoker  . Smokeless tobacco: Never Used  Substance and Sexual Activity  . Alcohol use: No    Alcohol/week: 0.0 oz  . Drug use: No  . Sexual activity: Not Currently  Other Topics Concern  . Not on file  Social History Narrative  . Not on file    PSH:  Past Surgical History:  Procedure Laterality Date  . ABDOMINAL HYSTERECTOMY  1978  . HIP FRACTURE SURGERY Right   . Procedures since admission: No admission procedures for hospital encounter.  ROS: Review of systems normal other than 12 systems except per HPI.  PHYSICAL EXAM  Vitals: Blood pressure (!) 147/77, pulse (!) 105, temperature 98 F (36.7 C), temperature source Oral, resp. rate (!) 24, weight 135 lb (61.2 kg), SpO2 98 %.. General: Well-developed, Well-nourished in no acute distress Mood: Mood and affect well adjusted, pleasant and  cooperative. Orientation: Grossly alert and oriented. Vocal Quality: No hoarseness, breathiness or stridor. Communicates verbally. head and Face: NCAT. No facial asymmetry. No visible skin lesions. No significant facial scars. No tenderness with sinus percussion. Facial strength normal and symmetric. Ears: External ears with normal landmarks, no lesions. External auditory canals free of infection, cerumen impaction or lesions.  Tympanic membranes intact with good landmarks and normal mobility on pneumatic otoscopy. No middle ear effusion. Hearing: Speech reception grossly normal. Nose: External nose normal with midline dorsum and no lesions or deformity. Nasal Cavity reveals essentially midline septum with normal inferior turbinates. No significant mucosal congestion or erythema. Nasal secretions are minimal and clear. No polyps seen on anterior rhinoscopy. Oral Cavity/ Oropharynx: Lips are normal with no lesions. She is edentulous. Gingiva healthy with no lesions or gingivitis. Oropharynx including tongue, buccal mucosa, floor of mouth, hard and soft palate, uvula and posterior pharynx free of exudates, erythema or lesions with normal symmetry and hydration.  Indirect Laryngoscopy/Nasopharyngoscopy: Visualization of the larynx, hypopharynx and nasopharynx is not possible in this setting with routine examination. Neck: Supple and symmetric with no palpable masses, tenderness or crepitance. There is fulness in the left thyroid lobe compared to the right, without tenderness. Parotid and submandibular glands are soft, nontender and symmetric, without masses. Lymphatic: Cervical lymph nodes are without palpable lymphadenopathy or tenderness. Respiratory: Normal respiratory effort without labored breathing. No stridor Cardiovascular: Carotid pulse shows regular rate and rhythm Neurologic: Cranial Nerves II through XII are grossly intact. Eyes: Gaze and Ocular Motility are grossly normal. PERRLA. No visible  nystagmus.  MEDICAL DECISION MAKING: Data Review:  Results for orders placed or performed during the hospital encounter of 12/15/16 (from the past 48 hour(s))  Comprehensive metabolic panel     Status: Abnormal   Collection Time: 12/15/16  2:32 PM  Result Value Ref Range   Sodium 134 (L) 135 - 145 mmol/L   Potassium 3.7 3.5 - 5.1 mmol/L   Chloride 101 101 - 111 mmol/L   CO2 23 22 - 32 mmol/L   Glucose, Bld 134 (H) 65 - 99 mg/dL   BUN 14 6 - 20 mg/dL   Creatinine, Ser 0.71 0.44 - 1.00 mg/dL   Calcium 9.5 8.9 - 10.3 mg/dL   Total Protein 7.6 6.5 - 8.1 g/dL   Albumin 4.0 3.5 - 5.0 g/dL   AST 36 15 - 41 U/L   ALT 21 14 - 54 U/L   Alkaline Phosphatase 131 (H) 38 - 126 U/L   Total Bilirubin 0.7 0.3 - 1.2 mg/dL   GFR calc non Af Amer >60 >60 mL/min   GFR calc Af Amer >60 >60 mL/min    Comment: (NOTE) The eGFR has been calculated using the CKD EPI equation. This calculation has not been validated in all clinical situations. eGFR's persistently <60 mL/min signify possible Chronic Kidney Disease.    Anion gap 10 5 - 15  CBC with Differential     Status: Abnormal   Collection Time: 12/15/16  2:32 PM  Result Value Ref Range   WBC 11.0 3.6 - 11.0 K/uL   RBC 4.29 3.80 - 5.20 MIL/uL   Hemoglobin 12.6 12.0 - 16.0 g/dL   HCT 36.9 35.0 - 47.0 %   MCV 86.2 80.0 - 100.0 fL   MCH 29.4 26.0 - 34.0 pg   MCHC 34.2 32.0 - 36.0 g/dL   RDW 13.1 11.5 - 14.5 %   Platelets 293 150 - 440 K/uL   Neutrophils Relative % 70 %   Neutro Abs 7.8 (H) 1.4 - 6.5 K/uL   Lymphocytes Relative 18 %   Lymphs Abs 1.9 1.0 - 3.6 K/uL   Monocytes Relative 11 %   Monocytes Absolute 1.2 (H) 0.2 - 0.9 K/uL   Eosinophils Relative 1 %   Eosinophils Absolute 0.1 0 - 0.7 K/uL  Basophils Relative 0 %   Basophils Absolute 0.0 0 - 0.1 K/uL  Protime-INR     Status: None   Collection Time: 12/15/16  2:32 PM  Result Value Ref Range   Prothrombin Time 13.4 11.4 - 15.2 seconds   INR 1.03   Urinalysis, Complete w  Microscopic     Status: Abnormal   Collection Time: 12/15/16  2:32 PM  Result Value Ref Range   Color, Urine YELLOW (A) YELLOW   APPearance HAZY (A) CLEAR   Specific Gravity, Urine 1.014 1.005 - 1.030   pH 6.0 5.0 - 8.0   Glucose, UA NEGATIVE NEGATIVE mg/dL   Hgb urine dipstick NEGATIVE NEGATIVE   Bilirubin Urine NEGATIVE NEGATIVE   Ketones, ur NEGATIVE NEGATIVE mg/dL   Protein, ur 30 (A) NEGATIVE mg/dL   Nitrite NEGATIVE NEGATIVE   Leukocytes, UA TRACE (A) NEGATIVE   RBC / HPF 0-5 0 - 5 RBC/hpf   WBC, UA 6-30 0 - 5 WBC/hpf   Bacteria, UA RARE (A) NONE SEEN   Squamous Epithelial / LPF 6-30 (A) NONE SEEN   Mucus PRESENT   APTT     Status: None   Collection Time: 12/15/16  2:32 PM  Result Value Ref Range   aPTT 29 24 - 36 seconds  . Ct Head Wo Contrast  Result Date: 12/15/2016 CLINICAL DATA:  C-spine trauma, high clinical risk EXAM: CT HEAD WITHOUT CONTRAST CT CERVICAL SPINE WITHOUT CONTRAST TECHNIQUE: Multidetector CT imaging of the head and cervical spine was performed following the standard protocol without intravenous contrast. Multiplanar CT image reconstructions of the cervical spine were also generated. COMPARISON:  None. FINDINGS: CT HEAD FINDINGS Brain: Moderate atrophy. Negative for hydrocephalus. Periventricular white matter hypodensity consistent with chronic microvascular ischemia. No acute infarct. Negative for mass lesion. Mild subarachnoid hemorrhage bilaterally right greater than left. 5 mm thick interhemispheric hygroma left frontal region. No midline shift or hydrocephalus. Vascular: Negative for hyperdense vessel. Skull: Negative for fracture. Sinuses/Orbits: Mucosal thickening sphenoid sinus. Bilateral lens replacement. No orbital mass. Other: None CT CERVICAL SPINE FINDINGS Alignment: Mild anterolisthesis C2-3 C3-4, and C4-5. Skull base and vertebrae: Negative for fracture Soft tissues and spinal canal: Atherosclerotic calcification the carotid. Large mass in the left  lower neck extending into the mediastinum. The mass measures 6 x 4 cm. This has heterogeneous density and appears to be of thyroid origin. This could be a goiter. Less likely neoplasm. This is displacing the esophagus significantly to the right. No adenopathy in the neck elsewhere. Disc levels: Advanced multilevel disc and facet degeneration throughout the cervical spine. Advanced degenerative changes C1-2. Upper chest: Lung apices clear Other: None IMPRESSION: Mild bilateral subarachnoid hemorrhage. No shift of the midline structures. Atrophy with chronic microvascular ischemia.  No acute infarct. 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 These results were called by telephone at the time of interpretation on 12/15/2016 at 12:07 pm to Dr. Conni Slipper , who verbally acknowledged these results. Electronically Signed   By: Franchot Gallo M.D.   On: 12/15/2016 12:07   Ct Soft Tissue Neck W Contrast  Result Date: 12/15/2016 CLINICAL DATA:  Initial evaluation for soft tissue mass seen on prior cervical spine CT. EXAM: CT NECK WITH CONTRAST TECHNIQUE: Multidetector CT imaging of the neck was performed using the standard protocol following the bolus administration of intravenous contrast. CONTRAST:  58m ISOVUE-300  IOPAMIDOL (ISOVUE-300) INJECTION 61% COMPARISON:  Prior CT of the cervical spine performed earlier the same day. FINDINGS: Pharynx and larynx: Oral cavity within normal limits without mass lesion or loculated fluid collection. Patient is nearly edentulous. No acute abnormality about the remaining dentition. Pelvis in tonsils symmetric and normal. Parapharyngeal fat preserved. Nasopharynx normal. Retropharyngeal soft tissues within normal limits. Epiglottis normal. Vallecula clear. Remainder of the hypopharynx and supraglottic larynx within normal  limits. True cords apposed and not well evaluated. Subglottic trachea bowed to the right due to the enlarged multinodular goiter. Salivary glands: Diffuse fatty atrophy of the parotid and submandibular glands bilaterally. Thyroid: Enlarged heterogeneous goiter involving the left lobe of thyroid with substernal extension into the upper mediastinum. Scattered internal calcifications. Thyroid measures 4.1 x 6.1 x 9.9 cm (AP by transverse by craniocaudad) mass effect on the subglottic trachea and esophagus which are bowed to the right. Trachea remains widely patent. Right lobe of thyroid and thyroid isthmus are normal in appearance. Lymph nodes: No adenopathy within the neck. Vascular: Normal intravascular enhancement seen throughout the neck. Left common carotid artery is splayed around the enlarged left thyroid lobe. Internal carotid arteries medialized into the retropharyngeal space. Carotid bifurcation atherosclerosis. Limited intracranial: Visualized portions unremarkable. Visualized orbits: Globes and orbital soft tissues within normal limits. Patient status post cataract extraction bilaterally. Mastoids and visualized paranasal sinuses: Chronic sphenoid sinusitis. Paranasal sinuses otherwise largely clear. Visualize mastoids and middle ear cavities are clear. Skeleton: Better evaluated on recent CT of the cervical spine. No definite acute osseous abnormality. No worrisome lytic or blastic osseous lesions. Moderate degenerative spondylolysis at C5-6 and C6-7. Prominent osteoarthritic changes present about the C1-2 articulation. Extensive osteoarthrosis about the shoulders bilaterally, right worse than left. Upper chest: Visualized upper chest demonstrates no acute abnormality. Visualized lungs are clear. Other: None. IMPRESSION: 1. Enlarged multinodular goiter involving the left thyroid lobe with substernal extension into the upper mediastinum. Associated mass effect on the subglottic trachea and upper esophagus  which are bowed to the right. 2. No other acute abnormality identified within the neck. 3. Chronic sphenoid sinusitis. 4. Moderate degenerative spondylolysis at C5-6 and C6-7, with advanced degenerative changes about the shoulders bilaterally, right greater than left. Electronically Signed   By: Jeannine Boga M.D.   On: 12/15/2016 16:37   Ct Chest W Contrast  Result Date: 12/15/2016 CLINICAL DATA:  Mediastinal mass, suspected substernal goiter EXAM: CT CHEST WITH CONTRAST TECHNIQUE: Multidetector CT imaging of the chest was performed during intravenous contrast administration. CONTRAST:  71m ISOVUE-300 IOPAMIDOL (ISOVUE-300) INJECTION 61% COMPARISON:  CT cervical spine dated 12/15/2016. CT chest dated 05/05/2012. FINDINGS: Cardiovascular: The heart is normal in size. No pericardial effusion. No evidence of thoracic aortic aneurysm. Mild atherosclerotic calcifications of the aortic arch. Very mild coronary atherosclerosis of the LAD and right coronary artery. Mediastinum/Nodes: No suspicious mediastinal lymphadenopathy. Lobulated 4.1 x 6.1 cm mass at the left thoracic inlet (series 3/image 8), contiguous with the posterior aspect of the left thyroid gland (series 3/ image 1), compatible with substernal goiter. This is incompletely visualized on CT chest. Multiple underlying nodules are present, measuring up to at least 2.2 cm. Previously, on 2014 CT, the mass measured 4.2 x 5.9 cm in axial dimensions. Lungs/Pleura: Lungs are clear. No suspicious pulmonary nodules. No focal consolidation. No pleural effusion or pneumothorax. Upper Abdomen: Visualized upper abdomen is notable for bilateral renal cysts, including a 9.8 cm right upper pole renal cyst with thin partially calcified septation (series 3/image 56), benign (Bosniak II). Musculoskeletal:  Degenerative changes of the visualized thoracolumbar spine. IMPRESSION: Lobulated 4.1 x 6.1 cm mass at the left thoracic inlet, incompletely visualized on CT chest  but contiguous with the left thyroid gland, compatible with benign substernal multinodular goiter, grossly unchanged from 2014. Given the patient's age and 4 year stability, dedicated follow-up imaging is not required. Electronically Signed   By: Julian Hy M.D.   On: 12/15/2016 16:43   Ct Cervical Spine Wo Contrast  Result Date: 12/15/2016 CLINICAL DATA:  C-spine trauma, high clinical risk EXAM: CT HEAD WITHOUT CONTRAST CT CERVICAL SPINE WITHOUT CONTRAST TECHNIQUE: Multidetector CT imaging of the head and cervical spine was performed following the standard protocol without intravenous contrast. Multiplanar CT image reconstructions of the cervical spine were also generated. COMPARISON:  None. FINDINGS: CT HEAD FINDINGS Brain: Moderate atrophy. Negative for hydrocephalus. Periventricular white matter hypodensity consistent with chronic microvascular ischemia. No acute infarct. Negative for mass lesion. Mild subarachnoid hemorrhage bilaterally right greater than left. 5 mm thick interhemispheric hygroma left frontal region. No midline shift or hydrocephalus. Vascular: Negative for hyperdense vessel. Skull: Negative for fracture. Sinuses/Orbits: Mucosal thickening sphenoid sinus. Bilateral lens replacement. No orbital mass. Other: None CT CERVICAL SPINE FINDINGS Alignment: Mild anterolisthesis C2-3 C3-4, and C4-5. Skull base and vertebrae: Negative for fracture Soft tissues and spinal canal: Atherosclerotic calcification the carotid. Large mass in the left lower neck extending into the mediastinum. The mass measures 6 x 4 cm. This has heterogeneous density and appears to be of thyroid origin. This could be a goiter. Less likely neoplasm. This is displacing the esophagus significantly to the right. No adenopathy in the neck elsewhere. Disc levels: Advanced multilevel disc and facet degeneration throughout the cervical spine. Advanced degenerative changes C1-2. Upper chest: Lung apices clear Other: None  IMPRESSION: Mild bilateral subarachnoid hemorrhage. No shift of the midline structures. Atrophy with chronic microvascular ischemia.  No acute infarct. 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 These results were called by telephone at the time of interpretation on 12/15/2016 at 12:07 pm to Dr. Conni Slipper , who verbally acknowledged these results. Electronically Signed   By: Franchot Gallo M.D.   On: 12/15/2016 12:07   Dg Hand Complete Left  Result Date: 12/15/2016 CLINICAL DATA:  Fall. EXAM: LEFT HAND - COMPLETE 3+ VIEW COMPARISON:  Left wrist x-rays dated October 15, 2013. FINDINGS: There is a comminuted, displaced fracture of the distal first metacarpal, likely extending into the thumb MCP joint. There is approximately 6 mm of dorsal displacement of the distal fragment with prominent ulnar angulation. No other fracture identified. Old ulnar styloid fracture. Severe degenerative changes involving the first [facility] joint and radiocarpal joint are again noted. Scattered mild to moderate degenerative changes involving the MCP and IP joints, also similar to prior study. Diffuse soft tissue swelling about the base of the thumb. IMPRESSION: 1. Comminuted, displaced, and angulated fracture of the distal first metacarpal, likely extending into the thumb MCP joint. Prominent surrounding soft tissue swelling at the base of the thumb. Electronically Signed   By: Titus Dubin M.D.   On: 12/15/2016 12:30  .   ASSESSMENT: Large left thyroid goiter, predominantly substernal. Given her age, work-up would be only at her discretion, but could include FNA. This is likely long standing. She has expressed no desire for a work-up. If she decides to have this evaluated further I  would recommend this be done at [facility] or [facility] given the substernal nature of the mass. While  the mass does displace the trachea and esophagus, the trachea is not actually compressed, so there is no reason to think intubation would be particularly difficult.   PLAN: CT findings were discussed with the patient and family, and discussed the airway concerns with Dr. Andree Elk, and encouraged him to review the CT, but this does not appear to create major airway concerns. Her advanced age and the subarachnoid hemorrhage would appear to be the more concerning issues related to anesthesia.   Riley Nearing, MD 12/15/2016 5:06 PM

## 2016-12-15 NOTE — ED Notes (Signed)
Patient transported to CT 

## 2016-12-15 NOTE — ED Triage Notes (Signed)
Pt walked to mailbox, tripped and fell, unwitnessed fall, pt complains of right hand pain, pt has small abrasion on right side of head, pt is alert and oriented at baseline, pt has dementia

## 2016-12-16 DIAGNOSIS — I10 Essential (primary) hypertension: Secondary | ICD-10-CM | POA: Diagnosis not present

## 2016-12-16 DIAGNOSIS — S62502B Fracture of unspecified phalanx of left thumb, initial encounter for open fracture: Secondary | ICD-10-CM | POA: Diagnosis not present

## 2016-12-16 DIAGNOSIS — I609 Nontraumatic subarachnoid hemorrhage, unspecified: Secondary | ICD-10-CM | POA: Diagnosis not present

## 2016-12-16 DIAGNOSIS — H409 Unspecified glaucoma: Secondary | ICD-10-CM | POA: Diagnosis not present

## 2016-12-16 LAB — BASIC METABOLIC PANEL
ANION GAP: 9 (ref 5–15)
BUN: 12 mg/dL (ref 6–20)
CALCIUM: 9 mg/dL (ref 8.9–10.3)
CO2: 26 mmol/L (ref 22–32)
CREATININE: 0.68 mg/dL (ref 0.44–1.00)
Chloride: 100 mmol/L — ABNORMAL LOW (ref 101–111)
Glucose, Bld: 114 mg/dL — ABNORMAL HIGH (ref 65–99)
Potassium: 3.6 mmol/L (ref 3.5–5.1)
Sodium: 135 mmol/L (ref 135–145)

## 2016-12-16 LAB — CBC
HCT: 35.4 % (ref 35.0–47.0)
Hemoglobin: 11.6 g/dL — ABNORMAL LOW (ref 12.0–16.0)
MCH: 28.7 pg (ref 26.0–34.0)
MCHC: 32.9 g/dL (ref 32.0–36.0)
MCV: 87.1 fL (ref 80.0–100.0)
PLATELETS: 267 10*3/uL (ref 150–440)
RBC: 4.06 MIL/uL (ref 3.80–5.20)
RDW: 13.4 % (ref 11.5–14.5)
WBC: 8.4 10*3/uL (ref 3.6–11.0)

## 2016-12-16 MED ORDER — CEPHALEXIN 500 MG PO CAPS
500.0000 mg | ORAL_CAPSULE | Freq: Three times a day (TID) | ORAL | Status: DC
  Administered 2016-12-16: 500 mg via ORAL
  Filled 2016-12-16 (×2): qty 1

## 2016-12-16 MED ORDER — HYDROCODONE-ACETAMINOPHEN 5-325 MG PO TABS: 1.0000 | ORAL_TABLET | ORAL | 0 refills | Status: AC | PRN

## 2016-12-16 MED ORDER — CEPHALEXIN 500 MG PO CAPS: 500.0000 mg | ORAL_CAPSULE | Freq: Three times a day (TID) | ORAL | 0 refills | Status: AC

## 2016-12-16 NOTE — Plan of Care (Signed)
  Progressing Clinical Measurements: Will remain free from infection 12/16/2016 0427 - Progressing by Sharunda Salmon, Genelle GatherMatthew Scott, RN Diagnostic test results will improve 12/16/2016 [phone] - Progressing by Luba Matzen, Genelle GatherMatthew Scott, RN Respiratory complications will improve 12/16/2016 0427 - Progressing by Roxana HiresPage, Lucyann Romano Scott, RN Cardiovascular complication will be avoided 12/16/2016 0427 - Progressing by Zymarion Favorite, Genelle GatherMatthew Scott, RN Activity: Risk for activity intolerance will decrease 12/16/2016 0427 - Progressing by Shawneen Deetz, Genelle GatherMatthew Scott, RN Nutrition: Adequate nutrition will be maintained 12/16/2016 0427 - Progressing by Roxana HiresPage, Peggye Poon Scott, RN Coping: Level of anxiety will decrease 12/16/2016 0427 - Progressing by Roxana HiresPage, [place] Masaki Scott, RN Elimination: Will not experience complications related to bowel motility 12/16/2016 0427 - Progressing by Tally Mattox, Genelle GatherMatthew Scott, RN

## 2016-12-16 NOTE — Discharge Summary (Signed)
[facility] - [place] at [facility]   PATIENT NAME: Sarah [name]    MR#:  [id number]  DATE OF BIRTH:  1923/02/18  DATE OF ADMISSION:  12/15/2016 ADMITTING PHYSICIAN: Auburn Bilberry, MD  DATE OF DISCHARGE: 12/16/2016  PRIMARY CARE PHYSICIAN: Dale Zuehl, MD    ADMISSION DIAGNOSIS:  Neck mass [R22.1] Subarachnoid bleed (HCC) [I60.9] Open displaced fracture of phalanx of left thumb, unspecified phalanx, initial encounter [S62.502B]  DISCHARGE DIAGNOSIS:  Active Problems:   Fall SAH  Left thumb fracture  SECONDARY DIAGNOSIS:   Past Medical History:  Diagnosis Date  . Arthritis   . Depression   . Glaucoma   . Hypercholesterolemia   . Hypertension   . Subclinical hypothyroidism     HOSPITAL COURSE:   83 year old female with essential hypertension who presents after mechanical fall resulting in subarachnoid hemorrhage and left thumb fracture.  1. Mild bilateral subarachnoid hemorrhage status post mechanical fall: Patient was evaluated by neurosurgery. Patient has an unremarkable exam. She has no mental status changes. There is no midline shift on CT scan. Neurosurgery recommendations were for no surgical intervention and repeat imaging was not indicated. Patient will follow-up in clinic 4 weeks after discharge.  2. Left thumb fracture: Patient will undergo surgery in 1 week. She needs to keep her splint on. She will need Keflex according to Dr.Menz for one week.  3. Thyroid mass: This is not creating any major airway concerns. She was a value by ENT. At this time there will be conservative management.  4. Essential hypertension: Continue Norvasc  5. Glaucoma: Continue eyedrops  DISCHARGE CONDITIONS AND DIET:  Stable for discharge on regular diet  CONSULTS OBTAINED:  Treatment Team:  Kennedy Bucker, MD  DRUG ALLERGIES:   Allergies  Allergen Reactions  . Codeine Nausea And Vomiting  . Morphine And Related Other (See Comments)     Hallucinations    DISCHARGE MEDICATIONS:   Current Discharge Medication List    START taking these medications   Details  cephALEXin (KEFLEX) 500 MG capsule Take 1 capsule (500 mg total) by mouth every 8 (eight) hours for 6 days. Qty: 18 capsule, Refills: 0    HYDROcodone-acetaminophen (NORCO/VICODIN) 5-325 MG tablet Take 1-2 tablets by mouth every 4 (four) hours as needed for moderate pain. Qty: 30 tablet, Refills: 0      CONTINUE these medications which have NOT CHANGED   Details  ALPRAZolam (XANAX) 0.5 MG tablet Take 0.25-0.5 mg by mouth daily as needed for anxiety.  Refills: 0    amLODipine (NORVASC) 5 MG tablet take 1 tablet by mouth once daily Qty: 30 tablet, Refills: 5    fluticasone (FLONASE) 50 MCG/ACT nasal spray Place 2 sprays into both nostrils daily. Qty: 16 g, Refills: 2    latanoprost (XALATAN) 0.005 % ophthalmic solution Place 1 drop into both eyes at bedtime.  Refills: 0    Multiple Vitamins-Minerals (CENTRUM SILVER PO) Take 1 tablet by mouth daily.    Olopatadine HCl 0.2 % SOLN 1 drop to each eye daily. Qty: 2.5 mL, Refills: 0    timolol (TIMOPTIC-XR) 0.5 % ophthalmic gel-forming Place 1 drop into both eyes daily.  Refills: 0    ondansetron (ZOFRAN ODT) 4 MG disintegrating tablet Take 1 tablet (4 mg total) by mouth every 8 (eight) hours as needed for nausea or vomiting. Qty: 10 tablet, Refills: 0          Today   CHIEF COMPLAINT:   Patient has no complaints today. She seems to  be at her baseline. Denies headache or vision changes.   VITAL SIGNS:  Blood pressure 131/67, pulse 97, temperature 98.3 F (36.8 C), temperature source Oral, resp. rate 15, height 5' 2.5" (1.588 m), weight 59.1 kg (130 lb 4.8 oz), SpO2 98 %.   REVIEW OF SYSTEMS:  Review of Systems  Constitutional: Negative.  Negative for chills, fever and malaise/fatigue.  HENT: Negative.  Negative for ear discharge, ear pain, hearing loss, nosebleeds and sore throat.   Eyes:  Negative.  Negative for blurred vision and pain.  Respiratory: Negative.  Negative for cough, hemoptysis, shortness of breath and wheezing.   Cardiovascular: Negative.  Negative for chest pain, palpitations and leg swelling.  Gastrointestinal: Negative.  Negative for abdominal pain, blood in stool, diarrhea, nausea and vomiting.  Genitourinary: Negative.  Negative for dysuria.  Musculoskeletal: Negative.  Negative for back pain.  Skin: Negative.   Neurological: Negative for dizziness, tremors, speech change, focal weakness, seizures and headaches.  Endo/Heme/Allergies: Negative.  Does not bruise/bleed easily.  Psychiatric/Behavioral: Positive for memory loss. Negative for depression, hallucinations and suicidal ideas.     PHYSICAL EXAMINATION:   GENERAL:  83 y.o.-year-old patient lying in the bed with no acute distress.  NECK:  Supple, no jugular venous distention. No thyroid enlargement, no tenderness.  LUNGS: Normal breath sounds bilaterally, no wheezing, rales,rhonchi  No use of accessory muscles of respiration.  CARDIOVASCULAR: S1, S2 normal. No murmurs, rubs, or gallops.  ABDOMEN: Soft, non-tender, non-distended. Bowel sounds present. No organomegaly or mass.  EXTREMITIES: No pedal edema, cyanosis, or clubbing.  PSYCHIATRIC: The patient is alert and oriented x 3.  SKIN: No obvious rash, lesion, or ulcer.  Her left thumb is swollen and bruised (spint is off) DATA REVIEW:   CBC Recent Labs  Lab 12/16/16 0345  WBC 8.4  HGB 11.6*  HCT 35.4  PLT 267    Chemistries  Recent Labs  Lab 12/15/16 1432 12/16/16 0345  NA 134* 135  K 3.7 3.6  CL 101 100*  CO2 23 26  GLUCOSE 134* 114*  BUN 14 12  CREATININE 0.71 0.68  CALCIUM 9.5 9.0  AST 36  --   ALT 21  --   ALKPHOS 131*  --   BILITOT 0.7  --     Cardiac Enzymes No results for input(s): TROPONINI in the last 168 hours.  Microbiology Results  @[email or web address] @  RADIOLOGY:  Ct Head Wo Contrast  Result Date:  12/15/2016 CLINICAL DATA:  C-spine trauma, high clinical risk EXAM: CT HEAD WITHOUT CONTRAST CT CERVICAL SPINE WITHOUT CONTRAST TECHNIQUE: Multidetector CT imaging of the head and cervical spine was performed following the standard protocol without intravenous contrast. Multiplanar CT image reconstructions of the cervical spine were also generated. COMPARISON:  None. FINDINGS: CT HEAD FINDINGS Brain: Moderate atrophy. Negative for hydrocephalus. Periventricular white matter hypodensity consistent with chronic microvascular ischemia. No acute infarct. Negative for mass lesion. Mild subarachnoid hemorrhage bilaterally right greater than left. 5 mm thick interhemispheric hygroma left frontal region. No midline shift or hydrocephalus. Vascular: Negative for hyperdense vessel. Skull: Negative for fracture. Sinuses/Orbits: Mucosal thickening sphenoid sinus. Bilateral lens replacement. No orbital mass. Other: None CT CERVICAL SPINE FINDINGS Alignment: Mild anterolisthesis C2-3 C3-4, and C4-5. Skull base and vertebrae: Negative for fracture Soft tissues and spinal canal: Atherosclerotic calcification the carotid. Large mass in the left lower neck extending into the mediastinum. The mass measures 6 x 4 cm. This has heterogeneous density and appears to be of thyroid origin. This could be a  goiter. Less likely neoplasm. This is displacing the esophagus significantly to the right. No adenopathy in the neck elsewhere. Disc levels: Advanced multilevel disc and facet degeneration throughout the cervical spine. Advanced degenerative changes C1-2. Upper chest: Lung apices clear Other: None IMPRESSION: Mild bilateral subarachnoid hemorrhage. No shift of the midline structures. Atrophy with chronic microvascular ischemia.  No acute infarct. Advanced cervical spine degenerative change without fracture 6 x 4 cm soft tissue mass left lower left neck most likely a substernal goiter extending and mediastinum with displacement of the  trachea. These results were called by telephone at the time of interpretation on 12/15/2016 at 12:07 pm to Dr. Dorothea Glassman , who verbally acknowledged these results. Electronically Signed   By: Marlan Palau M.D.   On: 12/15/2016 12:07   Ct Soft Tissue Neck W Contrast  Result Date: 12/15/2016 CLINICAL DATA:  Initial evaluation for soft tissue mass seen on prior cervical spine CT. EXAM: CT NECK WITH CONTRAST TECHNIQUE: Multidetector CT imaging of the neck was performed using the standard protocol following the bolus administration of intravenous contrast. CONTRAST:  75mL ISOVUE-300 IOPAMIDOL (ISOVUE-300) INJECTION 61% COMPARISON:  Prior CT of the cervical spine performed earlier the same day. FINDINGS: Pharynx and larynx: Oral cavity within normal limits without mass lesion or loculated fluid collection. Patient is nearly edentulous. No acute abnormality about the remaining dentition. Pelvis in tonsils symmetric and normal. Parapharyngeal fat preserved. Nasopharynx normal. Retropharyngeal soft tissues within normal limits. Epiglottis normal. Vallecula clear. Remainder of the hypopharynx and supraglottic larynx within normal limits. True cords apposed and not well evaluated. Subglottic trachea bowed to the right due to the enlarged multinodular goiter. Salivary glands: Diffuse fatty atrophy of the parotid and submandibular glands bilaterally. Thyroid: Enlarged heterogeneous goiter involving the left lobe of thyroid with substernal extension into the upper mediastinum. Scattered internal calcifications. Thyroid measures 4.1 x 6.1 x 9.9 cm (AP by transverse by craniocaudad) mass effect on the subglottic trachea and esophagus which are bowed to the right. Trachea remains widely patent. Right lobe of thyroid and thyroid isthmus are normal in appearance. Lymph nodes: No adenopathy within the neck. Vascular: Normal intravascular enhancement seen throughout the neck. Left common carotid artery is splayed around the  enlarged left thyroid lobe. Internal carotid arteries medialized into the retropharyngeal space. Carotid bifurcation atherosclerosis. Limited intracranial: Visualized portions unremarkable. Visualized orbits: Globes and orbital soft tissues within normal limits. Patient status post cataract extraction bilaterally. Mastoids and visualized paranasal sinuses: Chronic sphenoid sinusitis. Paranasal sinuses otherwise largely clear. Visualize mastoids and middle ear cavities are clear. Skeleton: Better evaluated on recent CT of the cervical spine. No definite acute osseous abnormality. No worrisome lytic or blastic osseous lesions. Moderate degenerative spondylolysis at C5-6 and C6-7. Prominent osteoarthritic changes present about the C1-2 articulation. Extensive osteoarthrosis about the shoulders bilaterally, right worse than left. Upper chest: Visualized upper chest demonstrates no acute abnormality. Visualized lungs are clear. Other: None. IMPRESSION: 1. Enlarged multinodular goiter involving the left thyroid lobe with substernal extension into the upper mediastinum. Associated mass effect on the subglottic trachea and upper esophagus which are bowed to the right. 2. No other acute abnormality identified within the neck. 3. Chronic sphenoid sinusitis. 4. Moderate degenerative spondylolysis at C5-6 and C6-7, with advanced degenerative changes about the shoulders bilaterally, right greater than left. Electronically Signed   By: Rise Mu M.D.   On: 12/15/2016 16:37   Ct Chest W Contrast  Result Date: 12/15/2016 CLINICAL DATA:  Mediastinal mass, suspected substernal goiter EXAM:  CT CHEST WITH CONTRAST TECHNIQUE: Multidetector CT imaging of the chest was performed during intravenous contrast administration. CONTRAST:  75mL ISOVUE-300 IOPAMIDOL (ISOVUE-300) INJECTION 61% COMPARISON:  CT cervical spine dated 12/15/2016. CT chest dated 05/05/2012. FINDINGS: Cardiovascular: The heart is normal in size. No  pericardial effusion. No evidence of thoracic aortic aneurysm. Mild atherosclerotic calcifications of the aortic arch. Very mild coronary atherosclerosis of the LAD and right coronary artery. Mediastinum/Nodes: No suspicious mediastinal lymphadenopathy. Lobulated 4.1 x 6.1 cm mass at the left thoracic inlet (series 3/image 8), contiguous with the posterior aspect of the left thyroid gland (series 3/ image 1), compatible with substernal goiter. This is incompletely visualized on CT chest. Multiple underlying nodules are present, measuring up to at least 2.2 cm. Previously, on 2014 CT, the mass measured 4.2 x 5.9 cm in axial dimensions. Lungs/Pleura: Lungs are clear. No suspicious pulmonary nodules. No focal consolidation. No pleural effusion or pneumothorax. Upper Abdomen: Visualized upper abdomen is notable for bilateral renal cysts, including a 9.8 cm right upper pole renal cyst with thin partially calcified septation (series 3/image 56), benign (Bosniak II). Musculoskeletal: Degenerative changes of the visualized thoracolumbar spine. IMPRESSION: Lobulated 4.1 x 6.1 cm mass at the left thoracic inlet, incompletely visualized on CT chest but contiguous with the left thyroid gland, compatible with benign substernal multinodular goiter, grossly unchanged from 2014. Given the patient's age and 4 year stability, dedicated follow-up imaging is not required. Electronically Signed   By: Charline BillsSriyesh  Krishnan M.D.   On: 12/15/2016 16:43   Ct Cervical Spine Wo Contrast  Result Date: 12/15/2016 CLINICAL DATA:  C-spine trauma, high clinical risk EXAM: CT HEAD WITHOUT CONTRAST CT CERVICAL SPINE WITHOUT CONTRAST TECHNIQUE: Multidetector CT imaging of the head and cervical spine was performed following the standard protocol without intravenous contrast. Multiplanar CT image reconstructions of the cervical spine were also generated. COMPARISON:  None. FINDINGS: CT HEAD FINDINGS Brain: Moderate atrophy. Negative for hydrocephalus.  Periventricular white matter hypodensity consistent with chronic microvascular ischemia. No acute infarct. Negative for mass lesion. Mild subarachnoid hemorrhage bilaterally right greater than left. 5 mm thick interhemispheric hygroma left frontal region. No midline shift or hydrocephalus. Vascular: Negative for hyperdense vessel. Skull: Negative for fracture. Sinuses/Orbits: Mucosal thickening sphenoid sinus. Bilateral lens replacement. No orbital mass. Other: None CT CERVICAL SPINE FINDINGS Alignment: Mild anterolisthesis C2-3 C3-4, and C4-5. Skull base and vertebrae: Negative for fracture Soft tissues and spinal canal: Atherosclerotic calcification the carotid. Large mass in the left lower neck extending into the mediastinum. The mass measures 6 x 4 cm. This has heterogeneous density and appears to be of thyroid origin. This could be a goiter. Less likely neoplasm. This is displacing the esophagus significantly to the right. No adenopathy in the neck elsewhere. Disc levels: Advanced multilevel disc and facet degeneration throughout the cervical spine. Advanced degenerative changes C1-2. Upper chest: Lung apices clear Other: None IMPRESSION: Mild bilateral subarachnoid hemorrhage. No shift of the midline structures. Atrophy with chronic microvascular ischemia.  No acute infarct. 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 These results were called by telephone at the time of interpretation on 12/15/2016 at 12:07 pm to Dr. Dorothea GlassmanPAUL MALINDA , who verbally acknowledged these results. Electronically Signed   By: Marlan Palauharles  Clark M.D.   On: 12/15/2016 12:07   Dg Hand Complete Left  Result Date: 12/15/2016 CLINICAL DATA:  Fall. EXAM: LEFT  HAND - COMPLETE 3+ VIEW COMPARISON:  Left wrist x-rays dated October 15, 2013. FINDINGS: There is a comminuted, displaced  fracture of the distal first metacarpal, likely extending into the thumb MCP joint. There is approximately 6 mm of dorsal displacement of the distal fragment with prominent ulnar angulation. No other fracture identified. Old ulnar styloid fracture. Severe degenerative changes involving the first [facility] and radiocarpal joint are again noted. Scattered mild to moderate degenerative changes involving the MCP and IP joints, also similar to prior study. Diffuse soft tissue swelling about the base of the thumb. IMPRESSION: 1. Comminuted, displaced, and angulated fracture of the distal first metacarpal, likely extending into the thumb MCP joint. Prominent surrounding soft tissue swelling at the base of the thumb. Electronically Signed   By: Obie DredgeWilliam T Derry M.D.   On: 12/15/2016 12:30      Current Discharge Medication List    START taking these medications   Details  cephALEXin (KEFLEX) 500 MG capsule Take 1 capsule (500 mg total) by mouth every 8 (eight) hours for 6 days. Qty: 18 capsule, Refills: 0    HYDROcodone-acetaminophen (NORCO/VICODIN) 5-325 MG tablet Take 1-2 tablets by mouth every 4 (four) hours as needed for moderate pain. Qty: 30 tablet, Refills: 0      CONTINUE these medications which have NOT CHANGED   Details  ALPRAZolam (XANAX) 0.5 MG tablet Take 0.25-0.5 mg by mouth daily as needed for anxiety.  Refills: 0    amLODipine (NORVASC) 5 MG tablet take 1 tablet by mouth once daily Qty: 30 tablet, Refills: 5    fluticasone (FLONASE) 50 MCG/ACT nasal spray Place 2 sprays into both nostrils daily. Qty: 16 g, Refills: 2    latanoprost (XALATAN) 0.005 % ophthalmic solution Place 1 drop into both eyes at bedtime.  Refills: 0    Multiple Vitamins-Minerals (CENTRUM SILVER PO) Take 1 tablet by mouth daily.    Olopatadine HCl 0.2 % SOLN 1 drop to each eye daily. Qty: 2.5 mL, Refills: 0    timolol (TIMOPTIC-XR) 0.5 % ophthalmic gel-forming Place 1 drop into both eyes daily.  Refills:  0    ondansetron (ZOFRAN ODT) 4 MG disintegrating tablet Take 1 tablet (4 mg total) by mouth every 8 (eight) hours as needed for nausea or vomiting. Qty: 10 tablet, Refills: 0         Management plans discussed with the patient and she is in agreement. Stable for discharge   Patient should follow up with pcp and neurosurgery in 4 weeks ortho in 1 week  CODE STATUS:     Code Status Orders  (From admission, onward)        Start     Ordered   12/15/16 1636  Full code  Continuous     12/15/16 1635    Code Status History    Date Active Date Inactive Code Status Order ID Comments User Context   This patient has a current code status but no historical code status.    Advance Directive Documentation     Most Recent Value  Type of Advance Directive  Healthcare Power of Attorney, Living will  Pre-existing out of facility DNR order (yellow form or pink MOST form)  No data  "MOST" Form in Place?  No data      TOTAL TIME TAKING CARE OF THIS PATIENT: 38 minutes.    Note: This dictation was prepared with Dragon dictation along with smaller phrase technology. Any transcriptional errors that result from this process are unintentional.  Ziona Wickens M.D on 12/16/2016 at 7:50 AM  Between 7am to 6pm - Pager - [phone] After 6pm go to [email or web address] - Social research officer, government  [facility]  Office  [phone]  CC: Primary care physician; Dale Rockland, MD

## 2016-12-16 NOTE — Progress Notes (Signed)
Discharge summary and AVS reviewed with daughter. Answered all questions. Provided 2 doses of AB along with 2 RXs. Instructed to follow up wit Ortho office. Escorted to personal vehicle via wc

## 2016-12-16 NOTE — Evaluation (Signed)
Physical Therapy Evaluation Patient Details Name: Sarah BarrierGeraldine K [name] MRN: [id number] DOB: 01/09/1924 Today's Date: 12/16/2016   History of Present Illness  Patient is a pleasant 83 y/o female that presents after a fall at home, where she hit her head and sustained comminuted L phalanx fracture near her thumb. No neurologic deficits noted thus far.   Clinical Impression  Patient sustained a fall at home and was noted to have sub-arachnoid bleed and L phalanx fracture near her thumb. She has had supervision throughout the day, and discussed with son given her presentation it would be prudent to have around the clock caregivers for her at this point. She is able to ambulate her household distance and ascend/descend steps in reciprocal fashion. It appears her fall was mechanical the other day, as her modified DGI score of 10/12 places her at risk for falling. HHPT recommended at this time for any home set up modifications.     Follow Up Recommendations Home health PT;Supervision/Assistance - 24 hour    Equipment Recommendations       Recommendations for Other Services       Precautions / Restrictions Precautions Precautions: Fall Restrictions Weight Bearing Restrictions: Yes LUE Weight Bearing: Non weight bearing      Mobility  Bed Mobility               General bed mobility comments: Patient was sitting at the EOB when PT entered the room.   Transfers Overall transfer level: Needs assistance Equipment used: None             General transfer comment: No loss of balance noted, contact guard assist only from therapist.   Ambulation/Gait Ambulation/Gait assistance: Supervision Ambulation Distance (Feet): 300 Feet Assistive device: None Gait Pattern/deviations: WFL(Within Functional Limits)   Gait velocity interpretation: at or above normal speed for age/gender General Gait Details: Modified DGI of 10/12, mild lateral deviations in gait, otherwise she is likely at  her baseline, appropriate speed and no buckling noted.   Stairs Stairs: Yes Stairs assistance: Supervision Stair Management: Two rails Number of Stairs: 4 General stair comments: Alternating steps performed, used LUE for skimming on rail for balance no WBing noted.   Wheelchair Mobility    Modified Rankin (Stroke Patients Only)       Balance Overall balance assessment: Needs assistance;History of Falls Sitting-balance support: No upper extremity supported Sitting balance-Leahy Scale: Good     Standing balance support: No upper extremity supported Standing balance-Leahy Scale: Good Standing balance comment: Modified DGI of 10/12                              Pertinent Vitals/Pain Pain Assessment: Faces Faces Pain Scale: Hurts a little bit Pain Location: L hand Pain Descriptors / Indicators: Sore Pain Intervention(s): Limited activity within patient's tolerance;Monitored during session    Home Living Family/patient expects to be discharged to:: Private residence Living Arrangements: Alone Available Help at Discharge: Available PRN/intermittently Type of Home: House Home Access: Stairs to enter Entrance Stairs-Rails: Can reach both Entrance Stairs-Number of Steps: "2-4 steps" Home Layout: One level        Prior Function Level of Independence: Independent         Comments: Patient has been an independent ambulator to this point.      Hand Dominance        Extremity/Trunk Assessment   Upper Extremity Assessment Upper Extremity Assessment: Overall WFL for tasks assessed(Finger nose finger WNL  on RUE )    Lower Extremity Assessment Lower Extremity Assessment: Overall WFL for tasks assessed       Communication   Communication: No difficulties  Cognition Arousal/Alertness: Awake/alert Behavior During Therapy: WFL for tasks assessed/performed Overall Cognitive Status: History of cognitive impairments - at baseline                                  General Comments: Age related memory and insight deficits.       General Comments General comments (skin integrity, edema, etc.): Bruising around her R eye from fall, cast on L hand.     Exercises     Assessment/Plan    PT Assessment Patient needs continued PT services  PT Problem List Decreased strength;Decreased mobility;Decreased activity tolerance;Decreased cognition;Decreased balance;Decreased knowledge of use of DME;Decreased safety awareness;Decreased knowledge of precautions       PT Treatment Interventions Therapeutic activities;DME instruction;Gait training;Therapeutic exercise;Patient/family education;Stair training;Balance training;Neuromuscular re-education    PT Goals (Current goals can be found in the Care Plan section)  Acute Rehab PT Goals Patient Stated Goal: To return home safely  PT Goal Formulation: With patient/family Time For Goal Achievement: 12/30/16 Potential to Achieve Goals: Good    Frequency Min 2X/week   Barriers to discharge        Co-evaluation               AM-PAC PT "6 Clicks" Daily Activity  Outcome Measure Difficulty turning over in bed (including adjusting bedclothes, sheets and blankets)?: A Little Difficulty moving from lying on back to sitting on the side of the bed? : A Little Difficulty sitting down on and standing up from a chair with arms (e.g., wheelchair, bedside commode, etc,.)?: None Help needed moving to and from a bed to chair (including a wheelchair)?: None Help needed walking in hospital room?: None Help needed climbing 3-5 steps with a railing? : A Little 6 Click Score: 21    End of Session Equipment Utilized During Treatment: Gait belt Activity Tolerance: Patient tolerated treatment well Patient left: in chair;with chair alarm set;with family/visitor present;with call bell/phone within reach Nurse Communication: Mobility status PT Visit Diagnosis: History of falling (Z91.81)    Time:  [id number] PT Time Calculation (min) (ACUTE ONLY): 12 min   Charges:   PT Evaluation $PT Eval Moderate Complexity: 1 Mod     PT G Codes:   PT G-Codes **NOT FOR INPATIENT CLASS** Functional Assessment Tool Used: AM-PAC 6 Clicks Basic Mobility Functional Limitation: Mobility: Walking and moving around Mobility: Walking and Moving Around Current Status ([id number](G8978): At least 1 percent but less than 20 percent impaired, limited or restricted Mobility: Walking and Moving Around Goal Status [id number](G8979): At least 1 percent but less than 20 percent impaired, limited or restricted    Alva GarnetPatrick Lendon George PT, DPT, CSCS    12/16/2016, 1:07 PM

## 2016-12-22 DIAGNOSIS — S62242B Displaced fracture of shaft of first metacarpal bone, left hand, initial encounter for open fracture: Secondary | ICD-10-CM | POA: Diagnosis not present

## 2016-12-27 DIAGNOSIS — F331 Major depressive disorder, recurrent, moderate: Secondary | ICD-10-CM | POA: Diagnosis not present

## 2016-12-29 DIAGNOSIS — S62242D Displaced fracture of shaft of first metacarpal bone, left hand, subsequent encounter for fracture with routine healing: Secondary | ICD-10-CM | POA: Diagnosis not present

## 2017-01-09 ENCOUNTER — Other Ambulatory Visit: Payer: Self-pay | Admitting: Internal Medicine

## 2017-01-27 DIAGNOSIS — S066X0D Traumatic subarachnoid hemorrhage without loss of consciousness, subsequent encounter: Secondary | ICD-10-CM | POA: Diagnosis not present

## 2017-01-31 ENCOUNTER — Emergency Department: Payer: Medicare HMO | Admitting: Anesthesiology

## 2017-01-31 ENCOUNTER — Other Ambulatory Visit: Payer: Self-pay

## 2017-01-31 ENCOUNTER — Ambulatory Visit
Admission: EM | Admit: 2017-01-31 | Discharge: 2017-01-31 | Disposition: A | Discharge status: Home / Self Care | Payer: Medicare HMO | Attending: Emergency Medicine | Admitting: Emergency Medicine

## 2017-01-31 ENCOUNTER — Emergency Department: Payer: Medicare HMO

## 2017-01-31 ENCOUNTER — Encounter: Payer: Self-pay | Admitting: Intensive Care

## 2017-01-31 ENCOUNTER — Encounter
Admission: EM | Disposition: A | Discharge status: Home / Self Care | Payer: Self-pay | Source: Home / Self Care | Attending: Emergency Medicine

## 2017-01-31 DIAGNOSIS — K222 Esophageal obstruction: Secondary | ICD-10-CM | POA: Diagnosis not present

## 2017-01-31 DIAGNOSIS — M1990 Unspecified osteoarthritis, unspecified site: Secondary | ICD-10-CM | POA: Insufficient documentation

## 2017-01-31 DIAGNOSIS — K2210 Ulcer of esophagus without bleeding: Secondary | ICD-10-CM | POA: Insufficient documentation

## 2017-01-31 DIAGNOSIS — R1110 Vomiting, unspecified: Secondary | ICD-10-CM | POA: Diagnosis not present

## 2017-01-31 DIAGNOSIS — X58XXXA Exposure to other specified factors, initial encounter: Secondary | ICD-10-CM | POA: Diagnosis not present

## 2017-01-31 DIAGNOSIS — Z7951 Long term (current) use of inhaled steroids: Secondary | ICD-10-CM | POA: Insufficient documentation

## 2017-01-31 DIAGNOSIS — Z885 Allergy status to narcotic agent status: Secondary | ICD-10-CM | POA: Diagnosis not present

## 2017-01-31 DIAGNOSIS — E7800 Pure hypercholesterolemia, unspecified: Secondary | ICD-10-CM | POA: Insufficient documentation

## 2017-01-31 DIAGNOSIS — Z811 Family history of alcohol abuse and dependence: Secondary | ICD-10-CM | POA: Insufficient documentation

## 2017-01-31 DIAGNOSIS — Z8261 Family history of arthritis: Secondary | ICD-10-CM | POA: Insufficient documentation

## 2017-01-31 DIAGNOSIS — T18128A Food in esophagus causing other injury, initial encounter: Secondary | ICD-10-CM | POA: Diagnosis not present

## 2017-01-31 DIAGNOSIS — F329 Major depressive disorder, single episode, unspecified: Secondary | ICD-10-CM | POA: Diagnosis not present

## 2017-01-31 DIAGNOSIS — Z79899 Other long term (current) drug therapy: Secondary | ICD-10-CM | POA: Insufficient documentation

## 2017-01-31 DIAGNOSIS — I10 Essential (primary) hypertension: Secondary | ICD-10-CM | POA: Insufficient documentation

## 2017-01-31 DIAGNOSIS — R1111 Vomiting without nausea: Secondary | ICD-10-CM

## 2017-01-31 DIAGNOSIS — K449 Diaphragmatic hernia without obstruction or gangrene: Secondary | ICD-10-CM | POA: Insufficient documentation

## 2017-01-31 DIAGNOSIS — R112 Nausea with vomiting, unspecified: Secondary | ICD-10-CM | POA: Diagnosis not present

## 2017-01-31 DIAGNOSIS — R69 Illness, unspecified: Secondary | ICD-10-CM | POA: Diagnosis not present

## 2017-01-31 DIAGNOSIS — Z8249 Family history of ischemic heart disease and other diseases of the circulatory system: Secondary | ICD-10-CM | POA: Diagnosis not present

## 2017-01-31 DIAGNOSIS — R079 Chest pain, unspecified: Secondary | ICD-10-CM | POA: Diagnosis not present

## 2017-01-31 DIAGNOSIS — T189XXA Foreign body of alimentary tract, part unspecified, initial encounter: Secondary | ICD-10-CM | POA: Diagnosis not present

## 2017-01-31 HISTORY — PX: ESOPHAGOGASTRODUODENOSCOPY: SHX5428

## 2017-01-31 LAB — CBC WITH DIFFERENTIAL/PLATELET
BASOS ABS: 0.1 10*3/uL (ref 0–0.1)
BASOS PCT: 1 %
EOS PCT: 1 %
Eosinophils Absolute: 0.1 10*3/uL (ref 0–0.7)
HCT: 43 % (ref 35.0–47.0)
Hemoglobin: 14.1 g/dL (ref 12.0–16.0)
Lymphocytes Relative: 23 %
Lymphs Abs: 2 10*3/uL (ref 1.0–3.6)
MCH: 28.9 pg (ref 26.0–34.0)
MCHC: 32.8 g/dL (ref 32.0–36.0)
MCV: 88 fL (ref 80.0–100.0)
Monocytes Absolute: 0.8 10*3/uL (ref 0.2–0.9)
Monocytes Relative: 9 %
Neutro Abs: 5.6 10*3/uL (ref 1.4–6.5)
Neutrophils Relative %: 66 %
PLATELETS: 372 10*3/uL (ref 150–440)
RBC: 4.88 MIL/uL (ref 3.80–5.20)
RDW: 13.2 % (ref 11.5–14.5)
WBC: 8.5 10*3/uL (ref 3.6–11.0)

## 2017-01-31 LAB — COMPREHENSIVE METABOLIC PANEL
ALT: 25 U/L (ref 14–54)
AST: 44 U/L — ABNORMAL HIGH (ref 15–41)
Albumin: 4.5 g/dL (ref 3.5–5.0)
Alkaline Phosphatase: 133 U/L — ABNORMAL HIGH (ref 38–126)
Anion gap: 13 (ref 5–15)
BUN: 16 mg/dL (ref 6–20)
CO2: 26 mmol/L (ref 22–32)
Calcium: 10 mg/dL (ref 8.9–10.3)
Chloride: 101 mmol/L (ref 101–111)
Creatinine, Ser: 1.03 mg/dL — ABNORMAL HIGH (ref 0.44–1.00)
GFR calc Af Amer: 53 mL/min — ABNORMAL LOW (ref >60)
GFR calc non Af Amer: 46 mL/min — ABNORMAL LOW (ref >60)
Glucose, Bld: 159 mg/dL — ABNORMAL HIGH (ref 65–99)
Potassium: 4.9 mmol/L (ref 3.5–5.1)
Sodium: 140 mmol/L (ref 135–145)
Total Bilirubin: 1.1 mg/dL (ref 0.3–1.2)
Total Protein: 8.1 g/dL (ref 6.5–8.1)

## 2017-01-31 LAB — LIPASE, BLOOD: LIPASE: 26 U/L (ref 11–51)

## 2017-01-31 LAB — TROPONIN I: Troponin I: <0.03 ng/mL (ref <0.03)

## 2017-01-31 SURGERY — EGD (ESOPHAGOGASTRODUODENOSCOPY)
Anesthesia: General

## 2017-01-31 MED ORDER — LIDOCAINE HCL (CARDIAC) 20 MG/ML IV SOLN
INTRAVENOUS | Status: DC | PRN
  Administered 2017-01-31: 80 mg via INTRAVENOUS

## 2017-01-31 MED ORDER — GLUCAGON HCL (RDNA) 1 MG IJ SOLR
0.5000 mg | Freq: Once | INTRAMUSCULAR | Status: AC
  Administered 2017-01-31: 0.5 mg via INTRAVENOUS
  Filled 2017-01-31: qty 0.5

## 2017-01-31 MED ORDER — FENTANYL CITRATE (PF) 100 MCG/2ML IJ SOLN
INTRAMUSCULAR | Status: AC
  Filled 2017-01-31: qty 2

## 2017-01-31 MED ORDER — SEVOFLURANE IN SOLN
RESPIRATORY_TRACT | Status: AC
  Filled 2017-01-31: qty 250

## 2017-01-31 MED ORDER — SODIUM CHLORIDE 0.9 % IV SOLN
INTRAVENOUS | Status: DC | PRN
  Administered 2017-01-31: 16:00:00 via INTRAVENOUS

## 2017-01-31 MED ORDER — DEXAMETHASONE SODIUM PHOSPHATE 10 MG/ML IJ SOLN
INTRAMUSCULAR | Status: AC
  Filled 2017-01-31: qty 1

## 2017-01-31 MED ORDER — ONDANSETRON HCL 4 MG/2ML IJ SOLN
INTRAMUSCULAR | Status: DC | PRN
  Administered 2017-01-31: 4 mg via INTRAVENOUS

## 2017-01-31 MED ORDER — PROPOFOL 10 MG/ML IV BOLUS
INTRAVENOUS | Status: DC | PRN
  Administered 2017-01-31: 100 mg via INTRAVENOUS

## 2017-01-31 MED ORDER — ONDANSETRON HCL 4 MG/2ML IJ SOLN
INTRAMUSCULAR | Status: AC
  Filled 2017-01-31: qty 2

## 2017-01-31 MED ORDER — DEXAMETHASONE SODIUM PHOSPHATE 10 MG/ML IJ SOLN
INTRAMUSCULAR | Status: DC | PRN
  Administered 2017-01-31: 5 mg via INTRAVENOUS

## 2017-01-31 MED ORDER — PROPOFOL 10 MG/ML IV BOLUS
INTRAVENOUS | Status: AC
  Filled 2017-01-31: qty 20

## 2017-01-31 MED ORDER — OMEPRAZOLE 20 MG PO CPDR: 20.0000 mg | DELAYED_RELEASE_CAPSULE | Freq: Two times a day (BID) | ORAL | 1 refills | Status: AC

## 2017-01-31 MED ORDER — ONDANSETRON HCL 4 MG/2ML IJ SOLN
4.0000 mg | Freq: Once | INTRAMUSCULAR | Status: AC
  Administered 2017-01-31: 4 mg via INTRAVENOUS

## 2017-01-31 MED ORDER — SUCCINYLCHOLINE CHLORIDE 20 MG/ML IJ SOLN
INTRAMUSCULAR | Status: AC
  Filled 2017-01-31: qty 1

## 2017-01-31 MED ORDER — GLUCAGON HCL (RDNA) 1 MG IJ SOLR
1.0000 mg | Freq: Once | INTRAMUSCULAR | Status: DC
  Filled 2017-01-31: qty 1

## 2017-01-31 MED ORDER — ESMOLOL HCL 100 MG/10ML IV SOLN
INTRAVENOUS | Status: DC | PRN
  Administered 2017-01-31 (×2): 20 mg via INTRAVENOUS

## 2017-01-31 MED ORDER — SUCCINYLCHOLINE CHLORIDE 20 MG/ML IJ SOLN
INTRAMUSCULAR | Status: DC | PRN
  Administered 2017-01-31: 80 mg via INTRAVENOUS

## 2017-01-31 MED ORDER — ONDANSETRON HCL 4 MG/2ML IJ SOLN: 4.0000 mg | Freq: Once | INTRAMUSCULAR | Status: DC | PRN

## 2017-01-31 MED ORDER — GLYCOPYRROLATE 0.2 MG/ML IJ SOLN
INTRAMUSCULAR | Status: AC
  Filled 2017-01-31: qty 1

## 2017-01-31 MED ORDER — FENTANYL CITRATE (PF) 100 MCG/2ML IJ SOLN: 25.0000 ug | INTRAMUSCULAR | Status: DC | PRN

## 2017-01-31 MED ORDER — LIDOCAINE HCL (PF) 2 % IJ SOLN
INTRAMUSCULAR | Status: AC
  Filled 2017-01-31: qty 10

## 2017-01-31 MED ORDER — FENTANYL CITRATE (PF) 100 MCG/2ML IJ SOLN
INTRAMUSCULAR | Status: DC | PRN
  Administered 2017-01-31 (×4): 25 ug via INTRAVENOUS

## 2017-01-31 MED ORDER — ESMOLOL HCL 100 MG/10ML IV SOLN
INTRAVENOUS | Status: AC
  Filled 2017-01-31: qty 10

## 2017-01-31 MED ORDER — GLYCOPYRROLATE 0.2 MG/ML IJ SOLN
INTRAMUSCULAR | Status: DC | PRN
  Administered 2017-01-31: 0.2 mg via INTRAVENOUS

## 2017-01-31 NOTE — Transfer of Care (Signed)
Immediate Anesthesia Transfer of Care Note  Patient: Sarah [name]  Procedure(s) Performed: ESOPHAGOGASTRODUODENOSCOPY (EGD) (N/A )  Patient Location: PACU  Anesthesia Type:General  Level of Consciousness: awake  Airway & Oxygen Therapy: Patient Spontanous Breathing and Patient connected to face mask oxygen  Post-op Assessment: Report given to RN and Post -op Vital signs reviewed and stable  Post vital signs: Reviewed  Last Vitals:  Vitals:   01/31/17 1539 01/31/17 1809  BP: (!) 157/82 (!) 167/98  Pulse: 98 99  Resp: 18 14  Temp: 36.5 C   SpO2: 97% 100%    Last Pain:  Vitals:   01/31/17 1539  TempSrc: Tympanic  PainSc: 0-No pain         Complications: No apparent anesthesia complications

## 2017-01-31 NOTE — ED Notes (Signed)
Caregiver at bedside

## 2017-01-31 NOTE — ED Provider Notes (Signed)
[facility] Emergency Department Provider Note  Time seen: 1:10 PM  I have reviewed the triage vital signs and the nursing notes.   HISTORY  Chief Complaint Emesis    HPI Sarah [name] is a 84 y.o. female with a past medical history depression, hypertension, hyperlipidemia, presents to the emergency department for vomiting.  According to the patient and caregiver patient has been vomiting or spitting up since yesterday.  Caregiver describes anytime she attempts to swallow it comes right back up.  Patient denies any symptoms at this time.  Denies any chest pain, abdominal pain or nausea.  Overall the patient appears well with a normal physical exam.  No distress, calm and cooperative.  The patient herself is a rather poor historian.   Past Medical History:  Diagnosis Date  . Arthritis   . Depression   . Glaucoma   . Hypercholesterolemia   . Hypertension   . Subclinical hypothyroidism     Patient Active Problem List   Diagnosis Date Noted  . Fall 12/15/2016  . Respiratory infection 06/08/2016  . Dysphagia 09/07/2014  . URI (upper respiratory infection) 05/26/2014  . Hallucinations 05/26/2014  . Abnormal chest CT 07/24/2012  . Hypertension 11/03/2011  . Hyperglycemia 11/03/2011  . Subclinical hypothyroidism 11/03/2011  . Hypercholesterolemia 11/03/2011  . Depression 11/03/2011    Past Surgical History:  Procedure Laterality Date  . ABDOMINAL HYSTERECTOMY  1978  . HIP FRACTURE SURGERY Right     Prior to Admission medications   Medication Sig Start Date End Date Taking? Authorizing Provider  ALPRAZolam Prudy Feeler(XANAX) 0.5 MG tablet Take 0.25-0.5 mg by mouth daily as needed for anxiety.  04/30/14   [provider]  amLODipine (NORVASC) 5 MG tablet take 1 tablet by mouth once daily 01/10/17   Dale DurhamScott, Charlene, MD  fluticasone [facility](FLONASE) 50 MCG/ACT nasal spray Place 2 sprays into both nostrils daily. 01/09/13   Dale DurhamScott, Charlene, MD   HYDROcodone-acetaminophen (NORCO/VICODIN) 5-325 MG tablet Take 1-2 tablets by mouth every 4 (four) hours as needed for moderate pain. 12/16/16   Adrian SaranMody, Sital, MD  latanoprost (XALATAN) 0.005 % ophthalmic solution Place 1 drop into both eyes at bedtime.  05/22/14   [provider]  Multiple Vitamins-Minerals (CENTRUM SILVER PO) Take 1 tablet by mouth daily.    [provider]  Olopatadine HCl 0.2 % SOLN 1 drop to each eye daily. 06/08/16   Tommie Samsook, Jayce G, DO  ondansetron (ZOFRAN ODT) 4 MG disintegrating tablet Take 1 tablet (4 mg total) by mouth every 8 (eight) hours as needed for nausea or vomiting. Patient not taking: Reported on 12/15/2016 11/20/15   Pricilla LovelessGoldston, Scott, MD  timolol (TIMOPTIC-XR) 0.5 % ophthalmic gel-forming Place 1 drop into both eyes daily.  05/09/14   [provider]    Allergies  Allergen Reactions  . Codeine Nausea And Vomiting  . Morphine And Related Other (See Comments)    Hallucinations    Family History  Problem Relation Age of Onset  . Alcohol abuse Mother   . Arthritis Mother   . Hyperlipidemia Mother   . Hypertension Mother   . Alcohol abuse Father   . Arthritis Father   . Hyperlipidemia Father   . Hypertension Father     Social History Social History   Tobacco Use  . Smoking status: Never Smoker  . Smokeless tobacco: Never Used  Substance Use Topics  . Alcohol use: No    Alcohol/week: 0.0 oz  . Drug use: No    Review  of Systems Constitutional: Negative for fever. Eyes: Negative for visual complaints ENT: Negative for congestion or recent illness Cardiovascular: Negative for chest pain. Respiratory: Negative for shortness of breath. Gastrointestinal: Positive for vomiting, negative for abdominal pain.  No diarrhea. Genitourinary: Negative for dysuria. Musculoskeletal: Negative for back pain. Skin: Negative for rash. Neurological: Negative for headache All other ROS  negative  ____________________________________________   PHYSICAL EXAM:  VITAL SIGNS: ED Triage Vitals  Enc Vitals Group     BP 01/31/17 1214 (!) 144/71     Pulse Rate 01/31/17 1214 (!) 105     Resp 01/31/17 1214 16     Temp 01/31/17 1214 99.3 F (37.4 C)     Temp Source 01/31/17 1214 Oral     SpO2 01/31/17 1214 99 %     Weight 01/31/17 1215 132 lb (59.9 kg)     Height 01/31/17 1215 5\' 2"  (1.575 m)     Head Circumference --      Peak Flow --      Pain Score 01/31/17 1253 0     Pain Loc --      Pain Edu? --      Excl. in GC? --    Constitutional: Alert, lying in bed comfortably.  Calm, cooperative, no distress.  No current complaints. Eyes: Normal exam ENT   Head: Normocephalic and atraumatic.   Nose: No congestion/rhinnorhea.   Mouth/Throat: Mucous membranes are moist. Cardiovascular: Normal rate, regular rhythm. Respiratory: Normal respiratory effort without tachypnea nor retractions. Breath sounds are clear Gastrointestinal: Soft and nontender. No distention.   Musculoskeletal: Nontender with normal range of motion in all extremities. Neurologic:  Normal speech and language. No gross focal neurologic deficits Skin:  Skin is warm, dry and intact.  Psychiatric: Mood and affect are normal.   ____________________________________________    EKG  EKG reviewed and interpreted by myself shows sinus tachycardia 101 bpm with a narrow QRS, normal axis, normal intervals, nonspecific but no concerning ST changes.  ____________________________________________    RADIOLOGY  Chest x-ray normal  ____________________________________________   INITIAL IMPRESSION / ASSESSMENT AND PLAN / ED COURSE  Pertinent labs & imaging results that were available during my care of the patient were reviewed by me and considered in my medical decision making (see chart for details).  Patient presents to the emergency department for vomiting.  Per caregiver this occurs after  anytime she eats or drinks since 1 PM yesterday.  Differential would include esophageal obstruction, gastroenteritis, enteritis, gastritis, SBO, intra-abdominal pathology.  We will check labs, treat with Zofran.  Will attempt to orally hydrate.  At this time it is not clear if the caregivers describing more of a vomiting episode or esophageal obstruction.  Caregiver states this is occurred previously when the patient has eaten meat and forgot to put in her dentures.  This would more likely be related to esophageal obstruction.  I will reviewed the patient's records and continue to closely monitor while awaiting lab and imaging results.  Labs have resulted largely within normal limits, no significant abnormalities noted.  Similar ED presentation in 2016 which resolved after glucagon administration.  Unclear if that was due to gastroenteritis versus esophageal impaction either.  Attempted soda in the emergency department with immediate vomiting/spit up with attempted swallow most consistent with impaction.  We will dose Zofran and reattempt fluids in approximately 10-20 minutes.  Attempted once again to have her swallow Cola, patient immediately regurgitated her swallowed soda.  Discussed the patient with GI they will begin  to perform likely EGD.  Patient agreeable to plan. ____________________________________________   FINAL CLINICAL IMPRESSION(S) / ED DIAGNOSES  vomiting Esophageal obstruction   Minna Antis, MD 01/31/17 1506

## 2017-01-31 NOTE — Anesthesia Post-op Follow-up Note (Signed)
Anesthesia QCDR form completed.        

## 2017-01-31 NOTE — Anesthesia Procedure Notes (Signed)
Procedure Name: Intubation Date/Time: 01/31/2017 4:02 PM Performed by: Doreen Salvage, CRNA Pre-anesthesia Checklist: Patient identified, Emergency Drugs available, Suction available and Patient being monitored Patient Re-evaluated:Patient Re-evaluated prior to induction Oxygen Delivery Method: Circle system utilized Preoxygenation: Pre-oxygenation with 100% oxygen Induction Type: IV induction, Cricoid Pressure applied and Rapid sequence Ventilation: Mask ventilation without difficulty Laryngoscope Size: Mac and 3 Grade View: Grade II Tube type: Oral Tube size: 7.0 mm Number of attempts: 1 Airway Equipment and Method: Stylet Placement Confirmation: ETT inserted through vocal cords under direct vision,  positive ETCO2 and breath sounds checked- equal and bilateral Secured at: 21 cm Tube secured with: Tape Dental Injury: Teeth and Oropharynx as per pre-operative assessment

## 2017-01-31 NOTE — ED Notes (Signed)
Pt given warm cola to drink per Dr. Lenard LancePaduchowski order. Pt immediately regurgitated liquid. Pt stated it felt like it would not go down. Dr. Lenard LancePaduchowski notified. Orders received to wait 20 minutes and try cola again.

## 2017-01-31 NOTE — ED Notes (Signed)
Patient transported to X-ray 

## 2017-01-31 NOTE — ED Notes (Addendum)
Pt attempted to swallow cola. Pt coughed immediately after drinking cola and then spit brown liquid into bag. Dr. Lenard LancePaduchowski notified.

## 2017-01-31 NOTE — ED Triage Notes (Signed)
Patient and caregiver report since yesterday the patient will go to swallow liquids or food and it comes right back up. Patient denies N/V/D. C/o R ear feeling stopped up and eye pain continuously. Denies pain anywhere.

## 2017-01-31 NOTE — Anesthesia Preprocedure Evaluation (Addendum)
Anesthesia Evaluation  Patient identified by MRN, date of birth, ID band Patient awake    Reviewed: Allergy & Precautions, H&P , NPO status , Patient's Chart, lab work & pertinent test results, reviewed documented beta blocker date and time   Airway Mallampati: II  TM Distance: >3 FB Neck ROM: full    Dental no notable dental hx. (+) Teeth Intact   Pulmonary neg pulmonary ROS,    Pulmonary exam normal breath sounds clear to auscultation       Cardiovascular Exercise Tolerance: Poor hypertension, On Medications negative cardio ROS   Rhythm:regular Rate:Normal     Neuro/Psych PSYCHIATRIC DISORDERS negative neurological ROS  negative psych ROS   GI/Hepatic negative GI ROS, Neg liver ROS,   Endo/Other  negative endocrine ROSHypothyroidism   Renal/GU      Musculoskeletal   Abdominal   Peds  Hematology negative hematology ROS (+)   Anesthesia Other Findings   Reproductive/Obstetrics negative OB ROS                             Anesthesia Physical  Anesthesia Plan  ASA: III and emergent  Anesthesia Plan: General   Post-op Pain Management:    Induction: Rapid sequence, Cricoid pressure planned and Intravenous  PONV Risk Score and Plan: 3  Airway Management Planned: Oral ETT  Additional Equipment:   Intra-op Plan:   Post-operative Plan: Extubation in OR  Informed Consent: I have reviewed the patients History and Physical, chart, labs and discussed the procedure including the risks, benefits and alternatives for the proposed anesthesia with the patient or authorized representative who has indicated his/her understanding and acceptance.     Plan Discussed with: CRNA  Anesthesia Plan Comments:         Anesthesia Quick Evaluation

## 2017-01-31 NOTE — Op Note (Signed)
[facility] Gastroenterology Patient Name: Sarah [name] Procedure Date: 01/31/2017 3:52 PM MRN: [id number] Account #: [id number] Date of Birth: 21-Oct-1923 Admit Type: Outpatient Age: 84 Room: [facility] ENDO ROOM 3 Gender: Female Note Status: Finalized Procedure:            Upper GI endoscopy Indications:          Foreign body in the esophagus Providers:            Lin Landsman MD, MD Referring MD:         Einar Pheasant, MD (Referring MD) Medicines:            General Anesthesia Complications:        No immediate complications. Estimated blood loss:                        Minimal. Procedure:            Pre-Anesthesia Assessment:                       - Prior to the procedure, a History and Physical was                        performed, and patient medications and allergies were                        reviewed. The patient is competent. The risks and                        benefits of the procedure and the sedation options and                        risks were discussed with the patient. All questions                        were answered and informed consent was obtained.                        Patient identification and proposed procedure were                        verified by the physician, the nurse, the                        anesthesiologist, the anesthetist and the technician in                        the pre-procedure area in the procedure room. Mental                        Status Examination: alert and oriented. Airway                        Examination: normal oropharyngeal airway and neck                        mobility. Respiratory Examination: clear to                        auscultation. CV Examination: normal. Prophylactic  Antibiotics: The patient does not require prophylactic                        antibiotics. Prior Anticoagulants: The patient has                        taken no previous anticoagulant or antiplatelet  agents.                        ASA Grade Assessment: II - A patient with mild systemic                        disease. After reviewing the risks and benefits, the                        patient was deemed in satisfactory condition to undergo                        the procedure. The anesthesia plan was to use monitored                        anesthesia care (MAC). Immediately prior to                        administration of medications, the patient was                        re-assessed for adequacy to receive sedatives. The                        heart rate, respiratory rate, oxygen saturations, blood                        pressure, adequacy of pulmonary ventilation, and                        response to care were monitored throughout the                        procedure. The physical status of the patient was                        re-assessed after the procedure.                       After obtaining informed consent, the endoscope was                        passed under direct vision. Throughout the procedure,                        the patient's blood pressure, pulse, and oxygen                        saturations were monitored continuously. The Endoscope                        was introduced through the mouth, and advanced to the  third part of duodenum. The upper GI endoscopy was                        unusually difficult due to large amounts of food in                        esophagus. The patient tolerated the procedure well. Findings:      Food was found in the middle third of the esophagus and in the lower       third of the esophagus. Removal of food was accomplished.      A medium-sized hiatal hernia was present.      The cardia and gastric fundus were normal on retroflexion.      The first portion of the duodenum, second portion of the duodenum and       third portion of the duodenum were normal.      Esophagitis with bleeding was found in the lower  third of the esophagus.       Estimated blood loss was minimal. Impression:           - Food in the middle third of the esophagus and in the                        lower third of the esophagus. Removal was successful.                       - Medium-sized hiatal hernia.                       - Normal first portion of the duodenum, second portion                        of the duodenum and third portion of the duodenum.                       - Erosive esophagitis secondary to irritation of the                        mucosa from food impaction. Recommendation:       - Discharge patient to home (with escort).                       - Clear liquid diet for 2 days.                       - Then advance diet to mechanical soft only                       - Wear dentures while eating                       - Continue present medications.                       - Use Prilosec (omeprazole) 20 mg PO BID for 3 months.                       - Return to GI clinic in 4 weeks. Procedure Code(s):    --- Professional ---  [phone], Esophagogastroduodenoscopy, flexible, transoral;                        with removal of foreign body(s) Diagnosis Code(s):    --- Professional ---                       T41.962I, Food in esophagus causing other injury,                        initial encounter                       K44.9, Diaphragmatic hernia without obstruction or                        gangrene                       K20.8, Other esophagitis                       T18.108A, Unspecified foreign body in esophagus causing                        other injury, initial encounter CPT copyright 2016 [employer]. All rights reserved. The codes documented in this report are preliminary and upon coder review may  be revised to meet current compliance requirements. Dr. Ulyess Mort Lin Landsman MD, MD 01/31/2017 6:13:23 PM This report has been signed electronically. Number of Addenda:  0 Note Initiated On: 01/31/2017 3:52 PM      [facility]

## 2017-01-31 NOTE — Consult Note (Signed)
Cephas Darby, MD [address]  [place]  [place], [place] [address]  Main: [phone]  Fax: [phone] Pager: [phone]   Consultation  Referring Provider:     No ref. provider found Primary Care Physician:  Einar Pheasant, MD Primary Gastroenterologist:      none Reason for Consultation:    Food impaction Date of Admission:  01/31/2017 Date of Consultation:  01/31/2017         HPI:   Sarah [name] is a 84 y.o. female who is accompanied by her caregiver to the ER as she is unable to swallow solids or liquids. Whenever she swallows liquids or food, it comes right back up. Patient denies nausea, vomiting, abdominal pain. She ate ham with sandwich last night around 11:30pm. Patient received glucagon in the ER but without any success. GI consulted for evaluation of food impaction. Apparently, patient tends to forget wearing dentures prior to eating food per her caregiver. She has similar episode in the past that relieved after receiving glucagon.    NSAIDs: None  Antiplts/Anticoagulants/Anti thrombotics: None   GI Procedures: none   Past Medical History:  Diagnosis Date  . Arthritis   . Depression   . Glaucoma   . Hypercholesterolemia   . Hypertension   . Subclinical hypothyroidism     Past Surgical History:  Procedure Laterality Date  . ABDOMINAL HYSTERECTOMY  1978  . HIP FRACTURE SURGERY Right     Prior to Admission medications   Medication Sig Start Date End Date Taking? Authorizing Provider  ALPRAZolam Duanne Moron) 0.5 MG tablet Take 0.25-0.5 mg by mouth daily as needed for anxiety.  04/30/14   [provider]  amLODipine (NORVASC) 5 MG tablet take 1 tablet by mouth once daily 01/10/17   Einar Pheasant, MD  fluticasone [facility]) 50 MCG/ACT nasal spray Place 2 sprays into both nostrils daily. 01/09/13   Einar Pheasant, MD  HYDROcodone-acetaminophen (NORCO/VICODIN) 5-325 MG tablet Take 1-2 tablets by mouth every 4 (four) hours as needed for  moderate pain. 12/16/16   Bettey Costa, MD  latanoprost (XALATAN) 0.005 % ophthalmic solution Place 1 drop into both eyes at bedtime.  05/22/14   [provider]  Multiple Vitamins-Minerals (CENTRUM SILVER PO) Take 1 tablet by mouth daily.    [provider]  Olopatadine HCl 0.2 % SOLN 1 drop to each eye daily. 06/08/16   Coral Spikes, DO  ondansetron (ZOFRAN ODT) 4 MG disintegrating tablet Take 1 tablet (4 mg total) by mouth every 8 (eight) hours as needed for nausea or vomiting. Patient not taking: Reported on 12/15/2016 11/20/15   Sherwood Gambler, MD  timolol (TIMOPTIC-XR) 0.5 % ophthalmic gel-forming Place 1 drop into both eyes daily.  05/09/14   [provider]    Family History  Problem Relation Age of Onset  . Alcohol abuse Mother   . Arthritis Mother   . Hyperlipidemia Mother   . Hypertension Mother   . Alcohol abuse Father   . Arthritis Father   . Hyperlipidemia Father   . Hypertension Father      Social History   Tobacco Use  . Smoking status: Never Smoker  . Smokeless tobacco: Never Used  Substance Use Topics  . Alcohol use: No    Alcohol/week: 0.0 oz  . Drug use: No    Allergies as of 01/31/2017 - Review Complete 01/31/2017  Allergen Reaction Noted  . Codeine Nausea And Vomiting 11/03/2011  . Morphine and related Other (See Comments) 05/27/2014  Review of Systems:    All systems reviewed and negative except where noted in HPI.   Physical Exam:  Vital signs in last 24 hours: Temp:  [97.7 F (36.5 C)-99.3 F (37.4 C)] 97.7 F (36.5 C) (01/07 1539) Pulse Rate:  [98-105] 98 (01/07 1539) Resp:  [16-19] 18 (01/07 1539) BP: (144-160)/(71-88) 157/82 (01/07 1539) SpO2:  [97 %-99 %] 97 % (01/07 1539) Weight:  [132 lb (59.9 kg)] 132 lb (59.9 kg) (01/07 1215)   General:   Pleasant, cooperative in NAD Head:  Normocephalic and atraumatic. Eyes:   No icterus.   Conjunctiva pink. PERRLA. Ears:  Normal auditory acuity. Neck:  Supple; no  masses or thyroidomegaly Lungs: Respirations even and unlabored. Lungs clear to auscultation bilaterally.   No wheezes, crackles, or rhonchi.  Heart:  Regular rate and rhythm;  Without murmur, clicks, rubs or gallops Abdomen:  Soft, nondistended, nontender. Normal bowel sounds. No appreciable masses or hepatomegaly.  No rebound or guarding.  Rectal:  Not performed. Msk:  Symmetrical without gross deformities.   Extremities:  Without edema, cyanosis or clubbing. Neurologic:  Alert and oriented x3;  grossly normal neurologically. Skin:  Intact without significant lesions or rashes. Cervical Nodes:  No significant cervical adenopathy. Psych:  Alert and cooperative. Normal affect.  LAB RESULTS: CBC Latest Ref Rng & Units 01/31/2017 12/16/2016 12/15/2016  WBC 3.6 - 11.0 K/uL 8.5 8.4 11.0  Hemoglobin 12.0 - 16.0 g/dL 14.1 11.6(L) 12.6  Hematocrit 35.0 - 47.0 % 43.0 35.4 36.9  Platelets 150 - 440 K/uL 372 267 293    BMET BMP Latest Ref Rng & Units 01/31/2017 12/16/2016 12/15/2016  Glucose 65 - 99 mg/dL 159(H) 114(H) 134(H)  BUN 6 - 20 mg/dL 16 12 14   Creatinine 0.44 - 1.00 mg/dL 1.03(H) 0.68 0.71  Sodium 135 - 145 mmol/L 140 135 134(L)  Potassium 3.5 - 5.1 mmol/L 4.9 3.6 3.7  Chloride 101 - 111 mmol/L 101 100(L) 101  CO2 22 - 32 mmol/L 26 26 23   Calcium 8.9 - 10.3 mg/dL 10.0 9.0 9.5    LFT Hepatic Function Latest Ref Rng & Units 01/31/2017 12/15/2016 10/13/2016  Total Protein 6.5 - 8.1 g/dL 8.1 7.6 7.1  Albumin 3.5 - 5.0 g/dL 4.5 4.0 4.1  AST 15 - 41 U/L 44(H) 36 20  ALT 14 - 54 U/L 25 21 16   Alk Phosphatase 38 - 126 U/L 133(H) 131(H) 107  Total Bilirubin 0.3 - 1.2 mg/dL 1.1 0.7 0.4  Bilirubin, Direct 0.0 - 0.3 mg/dL - - 0.1     STUDIES: Dg Chest 2 View  Result Date: 01/31/2017 CLINICAL DATA:  Chest pain and difficulty swallowing EXAM: CHEST  2 VIEW COMPARISON:  12/15/2016 FINDINGS: Cardiac shadow is within normal limits. The lungs are well aerated bilaterally. No focal infiltrate or  sizable effusion is seen. Degenerative changes of the thoracic spine are noted. Prominent soft tissue is noted in the superior mediastinum consistent with the large multinodular substernal goiter. Degenerative changes are noted in the shoulder joints right greater than left. IMPRESSION: No acute abnormality noted. Electronically Signed   By: Inez Catalina M.D.   On: 01/31/2017 13:01      Impression / Plan:   Sarah [name] is a 84 y.o. female with history of intractable regurgitation, probable food impaction. Patient forgot to wear dentures.  - Proceed with urgent EGD today  I have discussed alternative options, risks & benefits,  which include, but are not limited to, bleeding, infection, perforation,respiratory complication & drug  reaction.  The patient agrees with this plan & written consent will be obtained.    Thank you for involving me in the care of this patient.      LOS: 0 days   Sherri Sear, MD  01/31/2017, 3:56 PM   Note: This dictation was prepared with Dragon dictation along with smaller phrase technology. Any transcriptional errors that result from this process are unintentional.

## 2017-02-01 ENCOUNTER — Encounter: Payer: Self-pay | Admitting: Gastroenterology

## 2017-02-01 NOTE — Anesthesia Postprocedure Evaluation (Signed)
Anesthesia Post Note  Patient: Sarah [name]  Procedure(s) Performed: ESOPHAGOGASTRODUODENOSCOPY (EGD) (N/A )  Patient location during evaluation: PACU Anesthesia Type: General Level of consciousness: awake and alert and oriented Pain management: pain level controlled Vital Signs Assessment: post-procedure vital signs reviewed and stable Respiratory status: spontaneous breathing Cardiovascular status: blood pressure returned to baseline Anesthetic complications: no     Last Vitals:  Vitals:   01/31/17 1839 01/31/17 1849  BP: (!) 158/79 (!) 168/86  Pulse: (!) 102 (!) 109  Resp: 18 20  Temp:  37.1 C  SpO2: 100% 99%    Last Pain:  Vitals:   01/31/17 1849  TempSrc:   PainSc: 0-No pain                 Florena Kozma

## 2017-02-15 ENCOUNTER — Encounter: Payer: Self-pay | Admitting: Internal Medicine

## 2017-02-15 ENCOUNTER — Ambulatory Visit (INDEPENDENT_AMBULATORY_CARE_PROVIDER_SITE_OTHER): Payer: Medicare HMO | Admitting: Internal Medicine

## 2017-02-15 VITALS — BP 142/76 | HR 104 | Temp 98.7°F | Resp 16 | Wt 134.4 lb

## 2017-02-15 DIAGNOSIS — R413 Other amnesia: Secondary | ICD-10-CM

## 2017-02-15 DIAGNOSIS — I10 Essential (primary) hypertension: Secondary | ICD-10-CM

## 2017-02-15 DIAGNOSIS — E038 Other specified hypothyroidism: Secondary | ICD-10-CM

## 2017-02-15 DIAGNOSIS — F329 Major depressive disorder, single episode, unspecified: Secondary | ICD-10-CM

## 2017-02-15 DIAGNOSIS — E039 Hypothyroidism, unspecified: Secondary | ICD-10-CM | POA: Diagnosis not present

## 2017-02-15 DIAGNOSIS — E7800 Pure hypercholesterolemia, unspecified: Secondary | ICD-10-CM | POA: Diagnosis not present

## 2017-02-15 DIAGNOSIS — R739 Hyperglycemia, unspecified: Secondary | ICD-10-CM | POA: Diagnosis not present

## 2017-02-15 DIAGNOSIS — W19XXXD Unspecified fall, subsequent encounter: Secondary | ICD-10-CM

## 2017-02-15 DIAGNOSIS — R69 Illness, unspecified: Secondary | ICD-10-CM | POA: Diagnosis not present

## 2017-02-15 DIAGNOSIS — R9389 Abnormal findings on diagnostic imaging of other specified body structures: Secondary | ICD-10-CM

## 2017-02-15 DIAGNOSIS — F32A Depression, unspecified: Secondary | ICD-10-CM

## 2017-02-15 DIAGNOSIS — R7989 Other specified abnormal findings of blood chemistry: Secondary | ICD-10-CM | POA: Diagnosis not present

## 2017-02-15 LAB — BASIC METABOLIC PANEL
BUN: 11 mg/dL (ref 6–23)
CALCIUM: 9.9 mg/dL (ref 8.4–10.5)
CO2: 31 mEq/L (ref 19–32)
CREATININE: 0.77 mg/dL (ref 0.40–1.20)
Chloride: 104 mEq/L (ref 96–112)
GFR: 74.63 mL/min (ref >60.00)
Glucose, Bld: 110 mg/dL — ABNORMAL HIGH (ref 70–99)
Potassium: 4.9 mEq/L (ref 3.5–5.1)
Sodium: 142 mEq/L (ref 135–145)

## 2017-02-15 LAB — HEPATIC FUNCTION PANEL
ALBUMIN: 4 g/dL (ref 3.5–5.2)
ALT: 21 U/L (ref 0–35)
AST: 25 U/L (ref 0–37)
Alkaline Phosphatase: 1 U/L — ABNORMAL LOW (ref 39–117)
Bilirubin, Direct: 0.1 mg/dL (ref 0.0–0.3)
TOTAL PROTEIN: 7 g/dL (ref 6.0–8.3)
Total Bilirubin: 0.5 mg/dL (ref 0.2–1.2)

## 2017-02-15 LAB — HEMOGLOBIN A1C: HEMOGLOBIN A1C: 5.8 % (ref 4.6–6.5)

## 2017-02-15 LAB — TSH: TSH: <0.01 u[IU]/mL — ABNORMAL LOW (ref 0.35–4.50)

## 2017-02-15 LAB — T3, FREE: T3 FREE: 3.7 pg/mL (ref 2.3–4.2)

## 2017-02-15 LAB — T4, FREE: FREE T4: 1.14 ng/dL (ref 0.60–1.60)

## 2017-02-15 NOTE — Progress Notes (Signed)
Patient ID: Sarah [name], female   DOB: January 17, 1924, 84 y.o.   MRN: [id number]   Subjective:    Patient ID: Sarah [name], female    DOB: [birth year], 84 y.o.   MRN: [id number]  HPI  Patient here for a scheduled follow up.  She is accompanied by her caretaker.  History obtained from both of them.   She was seen 12/15/16 after a fall.  Had SAH and left thumb fracture.  Was evaluated by neurosurgery.  Cleared.  She has not had any other falls.  No chest pain.  She denies any problems swallowing.  No acid reflux.  No abdominal pain.  Bowels moving.  Trouble with her memory. Discussed today.  She declines any further w/up, evaluation or treatment.     Past Medical History:  Diagnosis Date  . Arthritis   . Depression   . Glaucoma   . Hypercholesterolemia   . Hypertension   . Subclinical hypothyroidism    Past Surgical History:  Procedure Laterality Date  . ABDOMINAL HYSTERECTOMY  1978  . ESOPHAGOGASTRODUODENOSCOPY N/A 01/31/2017   Procedure: ESOPHAGOGASTRODUODENOSCOPY (EGD);  Surgeon: Lin Landsman, MD;  Location: [facility] ENDOSCOPY;  Service: Gastroenterology;  Laterality: N/A;  . HIP FRACTURE SURGERY Right    Family History  Problem Relation Age of Onset  . Alcohol abuse Mother   . Arthritis Mother   . Hyperlipidemia Mother   . Hypertension Mother   . Alcohol abuse Father   . Arthritis Father   . Hyperlipidemia Father   . Hypertension Father    Social History   Socioeconomic History  . Marital status: Widowed    Spouse name: None  . Number of children: None  . Years of education: None  . Highest education level: None  Social Needs  . Financial resource strain: None  . Food insecurity - worry: None  . Food insecurity - inability: None  . Transportation needs - medical: None  . Transportation needs - non-medical: None  Occupational History  . None  Tobacco Use  . Smoking status: Never Smoker  . Smokeless tobacco: Never Used  Substance and Sexual  Activity  . Alcohol use: No    Alcohol/week: 0.0 oz  . Drug use: No  . Sexual activity: Not Currently  Other Topics Concern  . None  Social History Narrative  . None    Outpatient Encounter Medications as of 02/15/2017  Medication Sig  . ALPRAZolam (XANAX) 0.5 MG tablet Take 0.25-0.5 mg by mouth daily as needed for anxiety.   Marland Kitchen amLODipine (NORVASC) 5 MG tablet take 1 tablet by mouth once daily  . fluticasone (FLONASE) 50 MCG/ACT nasal spray Place 2 sprays into both nostrils daily.  Marland Kitchen HYDROcodone-acetaminophen (NORCO/VICODIN) 5-325 MG tablet Take 1-2 tablets by mouth every 4 (four) hours as needed for moderate pain.  Marland Kitchen latanoprost (XALATAN) 0.005 % ophthalmic solution Place 1 drop into both eyes at bedtime.   . mirtazapine (REMERON) 15 MG tablet Take 7.5 mg by mouth at bedtime.  . Multiple Vitamins-Minerals (CENTRUM SILVER PO) Take 1 tablet by mouth daily.  . Olopatadine HCl 0.2 % SOLN 1 drop to each eye daily.  Marland Kitchen omeprazole (PRILOSEC) 20 MG capsule Take 1 capsule (20 mg total) by mouth 2 (two) times daily before a meal.  . ondansetron (ZOFRAN ODT) 4 MG disintegrating tablet Take 1 tablet (4 mg total) by mouth every 8 (eight) hours as needed for nausea or vomiting. (Patient not taking: Reported on 12/15/2016)  . timolol (TIMOPTIC-XR) 0.5 %  ophthalmic gel-forming Place 1 drop into both eyes daily.    No facility-administered encounter medications on file as of 02/15/2017.     Review of Systems  Constitutional: Negative for appetite change and unexpected weight change.  HENT: Negative for congestion and sinus pressure.   Respiratory: Negative for cough, chest tightness and shortness of breath.   Cardiovascular: Negative for chest pain, palpitations and leg swelling.  Gastrointestinal: Negative for abdominal pain, diarrhea, nausea and vomiting.  Genitourinary: Negative for difficulty urinating and dysuria.  Musculoskeletal: Negative for joint swelling and myalgias.  Skin: Negative for  color change and rash.  Neurological: Negative for dizziness, light-headedness and headaches.  Psychiatric/Behavioral: Negative for agitation and dysphoric mood.       Objective:     Blood pressure rechecked by me:  142/76 Pulse:  96-100  Physical Exam  Constitutional: She appears well-developed and well-nourished. No distress.  HENT:  Nose: Nose normal.  Mouth/Throat: Oropharynx is clear and moist.  Neck: Neck supple.  Cardiovascular: Normal rate and regular rhythm.  Pulmonary/Chest: Breath sounds normal. No respiratory distress. She has no wheezes.  Abdominal: Soft. Bowel sounds are normal. There is no tenderness.  Musculoskeletal: She exhibits no edema or tenderness.  Lymphadenopathy:    She has no cervical adenopathy.  Skin: No rash noted. No erythema.  Psychiatric: She has a normal mood and affect. Her behavior is normal.    BP (!) 142/76   Pulse (!) 104   Temp 98.7 F (37.1 C) (Oral)   Resp 16   Wt 134 lb 6.4 oz (61 kg)   SpO2 98%   BMI 24.58 kg/m  Wt Readings from Last 3 Encounters:  02/15/17 134 lb 6.4 oz (61 kg)  01/31/17 132 lb (59.9 kg)  12/15/16 130 lb 4.8 oz (59.1 kg)     Lab Results  Component Value Date   WBC 8.5 01/31/2017   HGB 14.1 01/31/2017   HCT 43.0 01/31/2017   PLT 372 01/31/2017   GLUCOSE 110 (H) 02/15/2017   CHOL 222 (H) 10/13/2016   TRIG 171.0 (H) 10/13/2016   HDL 72.40 10/13/2016   LDLDIRECT 141.7 08/23/2013   LDLCALC 115 (H) 10/13/2016   ALT 21 02/15/2017   AST 25 02/15/2017   NA 142 02/15/2017   K 4.9 02/15/2017   CL 104 02/15/2017   CREATININE 0.77 02/15/2017   BUN 11 02/15/2017   CO2 31 02/15/2017   TSH <0.01 (L) 02/15/2017   INR 1.03 12/15/2016   HGBA1C 5.8 02/15/2017    Dg Chest 2 View  Result Date: 01/31/2017 CLINICAL DATA:  Chest pain and difficulty swallowing EXAM: CHEST  2 VIEW COMPARISON:  12/15/2016 FINDINGS: Cardiac shadow is within normal limits. The lungs are well aerated bilaterally. No focal infiltrate or  sizable effusion is seen. Degenerative changes of the thoracic spine are noted. Prominent soft tissue is noted in the superior mediastinum consistent with the large multinodular substernal goiter. Degenerative changes are noted in the shoulder joints right greater than left. IMPRESSION: No acute abnormality noted. Electronically Signed   By: Inez Catalina M.D.   On: 01/31/2017 13:01       Assessment & Plan:   Problem List Items Addressed This Visit    Abnormal chest CT    Have discussed further w/up.  She declines and continues to decline.        Depression    Followed by Dr Clovis Riley.       Relevant Medications   mirtazapine (REMERON) 15 MG tablet  Fall    Previous fall.  Had [place].  Released by neurosurgery.  No further falls.  Has caretaker.        Hypercholesterolemia    Follow lipid panel.        Relevant Orders   Hepatic function panel (Completed)   Hyperglycemia    Follow met b and a1c.       Relevant Orders   Hemoglobin A1c (Completed)   Hypertension - Primary    Blood pressure on recheck improved.  Same medication regimen.  Follow pressures.  Follow metabolic panel.        Relevant Orders   TSH (Completed)   Basic metabolic panel (Completed)   Memory change    Discussed with her today.  She declines any further w/up, evaluation or treatment. Has caretaker.  Follow.       Subclinical hypothyroidism    TSH has been suppressed.  Free T3 and Free T4 wnl.  Recheck today.        Other Visit Diagnoses    Low TSH level       Relevant Orders   T4, free (Completed)   T3, free (Completed)       Einar Pheasant, MD

## 2017-02-18 ENCOUNTER — Encounter: Payer: Self-pay | Admitting: Internal Medicine

## 2017-02-18 DIAGNOSIS — R413 Other amnesia: Secondary | ICD-10-CM | POA: Insufficient documentation

## 2017-02-18 NOTE — Assessment & Plan Note (Signed)
Follow met b and a1c.  

## 2017-02-18 NOTE — Assessment & Plan Note (Signed)
Previous fall.  Had SAH.  Released by neurosurgery.  No further falls.  Has caretaker.

## 2017-02-18 NOTE — Assessment & Plan Note (Signed)
Followed by Dr Levine.   

## 2017-02-18 NOTE — Assessment & Plan Note (Signed)
TSH has been suppressed.  Free T3 and Free T4 wnl.  Recheck today.

## 2017-02-18 NOTE — Assessment & Plan Note (Signed)
Have discussed further w/up.  She declines and continues to decline.

## 2017-02-18 NOTE — Assessment & Plan Note (Signed)
Follow lipid panel.   

## 2017-02-18 NOTE — Assessment & Plan Note (Signed)
Discussed with her today.  She declines any further w/up, evaluation or treatment. Has caretaker.  Follow.

## 2017-02-18 NOTE — Assessment & Plan Note (Signed)
Blood pressure on recheck improved.  Same medication regimen.  Follow pressures.  Follow metabolic panel.   

## 2017-02-21 DIAGNOSIS — H401132 Primary open-angle glaucoma, bilateral, moderate stage: Secondary | ICD-10-CM | POA: Diagnosis not present

## 2017-02-23 ENCOUNTER — Emergency Department
Admission: EM | Admit: 2017-02-23 | Discharge: 2017-02-23 | Disposition: A | Discharge status: Home / Self Care | Payer: Medicare HMO | Attending: Emergency Medicine | Admitting: Emergency Medicine

## 2017-02-23 ENCOUNTER — Other Ambulatory Visit: Payer: Self-pay

## 2017-02-23 ENCOUNTER — Emergency Department: Payer: Medicare HMO

## 2017-02-23 DIAGNOSIS — E039 Hypothyroidism, unspecified: Secondary | ICD-10-CM | POA: Insufficient documentation

## 2017-02-23 DIAGNOSIS — R220 Localized swelling, mass and lump, head: Secondary | ICD-10-CM | POA: Diagnosis not present

## 2017-02-23 DIAGNOSIS — Y999 Unspecified external cause status: Secondary | ICD-10-CM | POA: Diagnosis not present

## 2017-02-23 DIAGNOSIS — S0990XA Unspecified injury of head, initial encounter: Secondary | ICD-10-CM | POA: Insufficient documentation

## 2017-02-23 DIAGNOSIS — Y92481 Parking lot as the place of occurrence of the external cause: Secondary | ICD-10-CM | POA: Insufficient documentation

## 2017-02-23 DIAGNOSIS — I10 Essential (primary) hypertension: Secondary | ICD-10-CM | POA: Diagnosis not present

## 2017-02-23 DIAGNOSIS — W010XXA Fall on same level from slipping, tripping and stumbling without subsequent striking against object, initial encounter: Secondary | ICD-10-CM | POA: Diagnosis not present

## 2017-02-23 DIAGNOSIS — S0083XA Contusion of other part of head, initial encounter: Secondary | ICD-10-CM | POA: Insufficient documentation

## 2017-02-23 DIAGNOSIS — Y939 Activity, unspecified: Secondary | ICD-10-CM | POA: Diagnosis not present

## 2017-02-23 DIAGNOSIS — W19XXXA Unspecified fall, initial encounter: Secondary | ICD-10-CM

## 2017-02-23 DIAGNOSIS — Z79899 Other long term (current) drug therapy: Secondary | ICD-10-CM | POA: Diagnosis not present

## 2017-02-23 MED ORDER — QUAD CANE MISC: 0 refills | Status: AC

## 2017-02-23 NOTE — ED Notes (Signed)
First Nurse Note:  Patient here after fall in [employer] parking lot.  States she struck her face and right knee. Pupils equal and reactive to light.  Dried blood noted on face, no active bleeding. Alert and oriented.  Nephew with patient and he states she has been falling more often recently.

## 2017-02-23 NOTE — ED Provider Notes (Signed)
[facility] Emergency Department Provider Note  Time seen: 5:11 PM  I have reviewed the triage vital signs and the nursing notes.   HISTORY  Chief Complaint Fall    HPI Sarah [name] is a 84 y.o. female with a past medical history of arthritis, depression, hypertension, presents to the emergency department after a fall.  According to the patient and family she was walking in the parking lot when she tripped falling forwards hitting her head on the cement.  Denies loss of consciousness.  Denies vomiting.  Patient does have a left forehead hematoma which is why the son brought her to the emergency department for evaluation.  Patient states he feels fine, denies any complaints.  Denies any headache or pain.  Largely negative review of systems.  Past Medical History:  Diagnosis Date  . Arthritis   . Depression   . Glaucoma   . Hypercholesterolemia   . Hypertension   . Subclinical hypothyroidism     Patient Active Problem List   Diagnosis Date Noted  . Memory change 02/18/2017  . Esophageal obstruction   . Food impaction of esophagus   . Fall 12/15/2016  . Dysphagia 09/07/2014  . URI (upper respiratory infection) 05/26/2014  . Hallucinations 05/26/2014  . Abnormal chest CT 07/24/2012  . Hypertension 11/03/2011  . Hyperglycemia 11/03/2011  . Subclinical hypothyroidism 11/03/2011  . Hypercholesterolemia 11/03/2011  . Depression 11/03/2011    Past Surgical History:  Procedure Laterality Date  . ABDOMINAL HYSTERECTOMY  1978  . ESOPHAGOGASTRODUODENOSCOPY N/A 01/31/2017   Procedure: ESOPHAGOGASTRODUODENOSCOPY (EGD);  Surgeon: Toney ReilVanga, Rohini Reddy, MD;  [facility] ENDOSCOPY;  Service: Gastroenterology;  Laterality: N/A;  . HIP FRACTURE SURGERY Right     Prior to Admission medications   Medication Sig Start Date End Date Taking? Authorizing Provider  ALPRAZolam Prudy Feeler(XANAX) 0.5 MG tablet Take 0.25-0.5 mg by mouth daily as needed for anxiety.  04/30/14    [provider]  amLODipine (NORVASC) 5 MG tablet take 1 tablet by mouth once daily 01/10/17   Dale DurhamScott, Charlene, MD  fluticasone [facility](FLONASE) 50 MCG/ACT nasal spray Place 2 sprays into both nostrils daily. 01/09/13   Dale DurhamScott, Charlene, MD  HYDROcodone-acetaminophen (NORCO/VICODIN) 5-325 MG tablet Take 1-2 tablets by mouth every 4 (four) hours as needed for moderate pain. 12/16/16   Adrian SaranMody, Sital, MD  latanoprost (XALATAN) 0.005 % ophthalmic solution Place 1 drop into both eyes at bedtime.  05/22/14   [provider]  mirtazapine (REMERON) 15 MG tablet Take 7.5 mg by mouth at bedtime. 02/10/17   [provider]  Multiple Vitamins-Minerals (CENTRUM SILVER PO) Take 1 tablet by mouth daily.    [provider]  Olopatadine HCl 0.2 % SOLN 1 drop to each eye daily. 06/08/16   Tommie Samsook, Jayce G, DO  omeprazole (PRILOSEC) 20 MG capsule Take 1 capsule (20 mg total) by mouth 2 (two) times daily before a meal. 01/31/17 05/01/17  Vanga, [name], MD  ondansetron (ZOFRAN ODT) 4 MG disintegrating tablet Take 1 tablet (4 mg total) by mouth every 8 (eight) hours as needed for nausea or vomiting. Patient not taking: Reported on 12/15/2016 11/20/15   Pricilla LovelessGoldston, Scott, MD  timolol (TIMOPTIC-XR) 0.5 % ophthalmic gel-forming Place 1 drop into both eyes daily.  05/09/14   [provider]    Allergies  Allergen Reactions  . Codeine Nausea And Vomiting  . Morphine And Related Other (See Comments)    Hallucinations    Family History  Problem Relation Age of Onset  .  Alcohol abuse Mother   . Arthritis Mother   . Hyperlipidemia Mother   . Hypertension Mother   . Alcohol abuse Father   . Arthritis Father   . Hyperlipidemia Father   . Hypertension Father     Social History Social History   Tobacco Use  . Smoking status: Never Smoker  . Smokeless tobacco: Never Used  Substance Use Topics  . Alcohol use: No    Alcohol/week: 0.0 oz  . Drug use: No    Review of  Systems Constitutional: Negative for loss of consciousness Eyes: Negative for visual complaints ENT: Negative for facial injury, besides hematoma to forehead. Cardiovascular: Negative for chest pain. Respiratory: Negative for shortness of breath. Gastrointestinal: Negative for abdominal pain Genitourinary: Negative for urinary compaints Musculoskeletal: Hematoma to left forehead Skin: Left forehead hematoma Neurological: Negative for headache All other ROS negative  ____________________________________________   PHYSICAL EXAM:  VITAL SIGNS: ED Triage Vitals  Enc Vitals Group     BP 02/23/17 1408 (!) 157/68     Pulse Rate 02/23/17 1408 96     Resp 02/23/17 1408 17     Temp 02/23/17 1408 98.5 F (36.9 C)     Temp Source 02/23/17 1408 Oral     SpO2 02/23/17 1408 98 %     Weight 02/23/17 1409 134 lb (60.8 kg)     Height 02/23/17 1409 5\' 1"  (1.549 m)     Head Circumference --      Peak Flow --      Pain Score 02/23/17 1408 5     Pain Loc --      Pain Edu? --      Excl. in GC? --    Constitutional: Alert and oriented. Well appearing and in no distress. Eyes: Normal exam ENT   Head: Small to moderate sized left frontal hematoma.   Mouth/Throat: Mucous membranes are moist. Cardiovascular: Normal rate, regular rhythm. No murmur Respiratory: Normal respiratory effort without tachypnea nor retractions. Breath sounds are clear  Gastrointestinal: Soft and nontender. No distention. Musculoskeletal: Nontender with normal range of motion in all extremities. Neurologic:  Normal speech and language. No gross focal neurologic deficits  Skin:  Skin is warm, dry and intact.  Psychiatric: Mood and affect are normal.  ____________________________________________   RADIOLOGY  CT scan of the head shows a frontal hematoma but no other injury.  ____________________________________________   INITIAL IMPRESSION / ASSESSMENT AND PLAN / ED COURSE  Pertinent labs & imaging results  that were available during my care of the patient were reviewed by me and considered in my medical decision making (see chart for details).  Patient presents emergency department after mechanical fall.  Family states she has been somewhat more progressively off balance over the past few months to years.  Is not using a cane or walker.  Differential would include hematoma, contusion, ICH.  CT scan of the head shows a hematoma but no other intracranial abnormality.  I did discuss with the patient using a four-point walker when ambulating for stability.  She is agreeable to this plan of care.  Overall she appears very well, much younger than stated age.  We will discharge from the emergency department with PCP follow-up as needed.  ____________________________________________   FINAL CLINICAL IMPRESSION(S) / ED DIAGNOSES  Fall Head injury Hematoma    Minna Antis, MD 02/23/17 1715

## 2017-02-23 NOTE — ED Notes (Signed)
Pt in wheelchair. States fell today. Moving all extremities at this time. Talking in complete sentences. No distress noted.

## 2017-02-23 NOTE — ED Triage Notes (Signed)
Pt states she was [place] [employer] and tripped on the parking bumper and fell. Pt has a hematoma above the left brow and is c/o right knee pain. Denies LOC. Pt is a/ox4 on arrival. Denies any other injury. States she was ambulatory since fall.

## 2017-02-24 ENCOUNTER — Other Ambulatory Visit: Payer: Self-pay | Admitting: Family Medicine

## 2017-02-24 NOTE — Telephone Encounter (Signed)
Ok to refill given by cook in the past.

## 2017-02-24 NOTE — Telephone Encounter (Signed)
This was given to her at an acute visit 8 months ago for watering eyes.  Does she still need?

## 2017-03-02 DIAGNOSIS — F331 Major depressive disorder, recurrent, moderate: Secondary | ICD-10-CM | POA: Diagnosis not present

## 2017-03-02 DIAGNOSIS — R69 Illness, unspecified: Secondary | ICD-10-CM | POA: Diagnosis not present

## 2017-03-04 ENCOUNTER — Other Ambulatory Visit: Payer: Self-pay

## 2017-03-04 DIAGNOSIS — S52539A Colles' fracture of unspecified radius, initial encounter for closed fracture: Secondary | ICD-10-CM | POA: Insufficient documentation

## 2017-03-07 ENCOUNTER — Ambulatory Visit: Payer: Self-pay | Admitting: Gastroenterology

## 2017-03-15 ENCOUNTER — Ambulatory Visit: Payer: Self-pay | Admitting: Gastroenterology

## 2017-03-15 ENCOUNTER — Encounter: Payer: Self-pay | Admitting: *Deleted

## 2017-03-31 ENCOUNTER — Telehealth: Payer: Self-pay | Admitting: Internal Medicine

## 2017-03-31 NOTE — Telephone Encounter (Signed)
Copied from CRM [phone]. Topic: Quick Communication - See Telephone Encounter >> Mar 31, 2017  2:00 PM Diana EvesHoyt, Maryann B wrote: CRM for notification. See Telephone encounter for:  Pt's daughter calling in she is [facility] her [facility] [place], [place] a [place] care [place]. A form is being faxed over today which is a resident physical exam report. And they are needing this faxed back as soon as possible. Please fax to [phone] [facility]n to [facility]. Contact number to the facility is [phone]. Daughter  Olegario MessierKathy that is POA contact number is [phone].  03/31/17.

## 2017-03-31 NOTE — Telephone Encounter (Signed)
Please advise 

## 2017-04-01 ENCOUNTER — Ambulatory Visit (INDEPENDENT_AMBULATORY_CARE_PROVIDER_SITE_OTHER): Payer: Medicare HMO | Admitting: *Deleted

## 2017-04-01 DIAGNOSIS — Z111 Encounter for screening for respiratory tuberculosis: Secondary | ICD-10-CM | POA: Diagnosis not present

## 2017-04-01 NOTE — Telephone Encounter (Signed)
Received paperwork. Will fill out and have PCP sign.

## 2017-04-01 NOTE — Progress Notes (Signed)
Patient tolerated well.

## 2017-04-04 ENCOUNTER — Ambulatory Visit: Payer: Medicare HMO | Admitting: *Deleted

## 2017-04-04 DIAGNOSIS — Z111 Encounter for screening for respiratory tuberculosis: Secondary | ICD-10-CM

## 2017-04-04 LAB — TB SKIN TEST
INDURATION: 0 mm
TB SKIN TEST: NEGATIVE

## 2017-04-04 NOTE — Progress Notes (Signed)
Patient in for PPD read. 

## 2017-04-05 ENCOUNTER — Encounter: Payer: Self-pay | Admitting: Gastroenterology

## 2017-04-05 ENCOUNTER — Ambulatory Visit (INDEPENDENT_AMBULATORY_CARE_PROVIDER_SITE_OTHER): Payer: Medicare HMO | Admitting: Gastroenterology

## 2017-04-05 ENCOUNTER — Other Ambulatory Visit: Payer: Self-pay

## 2017-04-05 VITALS — BP 111/56 | HR 102 | Temp 98.7°F | Ht 61.0 in | Wt 139.2 lb

## 2017-04-05 DIAGNOSIS — K449 Diaphragmatic hernia without obstruction or gangrene: Secondary | ICD-10-CM

## 2017-04-05 DIAGNOSIS — K2210 Ulcer of esophagus without bleeding: Secondary | ICD-10-CM

## 2017-04-05 DIAGNOSIS — R1310 Dysphagia, unspecified: Secondary | ICD-10-CM | POA: Diagnosis not present

## 2017-04-05 DIAGNOSIS — R1319 Other dysphagia: Secondary | ICD-10-CM

## 2017-04-05 NOTE — Progress Notes (Signed)
Arlyss Repress, MD [address]  [address]  [place], [place] [address]  Main: [phone]  Fax: [phone]    Gastroenterology Consultation  Referring Provider:     Dale Nolic, MD Primary Care Physician:  Dale [place], MD Primary Gastroenterologist:  Dr. Arlyss Repress Reason for Consultation:   Dysphagia, erosive esophagitis        HPI:   Sarah [name] is a 84 y.o. female referred by Dr. Dale Potosi, MD  for consultation & management of dysphagia to solids. She had a food impaction on 01/31/2017 and I saw her in the emergency room, underwent EGD, food bolus was removed. She was found to have medium-size hiatal hernia, erosive esophagitis. I have started her on omeprazole 20 mg twice daily. Patient has history of dementia and also she did not weigh her dentures at that time. Currently, she has upper dentures. She is getting her lower dentures fixed. She is accompanied by her caregiver today. She lives at home by herself. She is here for follow-up. Patient denies dysphagia, heartburn, bloating, epigastric pain. According to her home health aide, she has been taking omeprazole 20 mg twice daily.  NSAIDs: none  Antiplts/Anticoagulants/Anti thrombotics: none  GI Procedures:  EGD 01/31/2017 - Food in the middle third of the esophagus and in the lower third of the esophagus. Removal was successful. - Medium-sized hiatal hernia. - Normal first portion of the duodenum, second portion of the duodenum and third portion of the duodenum. - Erosive esophagitis secondary to irritation of the mucosa from food impaction.  Past Medical History:  Diagnosis Date  . Arthritis   . Depression   . Glaucoma   . Hypercholesterolemia   . Hypertension   . Subclinical hypothyroidism     Past Surgical History:  Procedure Laterality Date  . ABDOMINAL HYSTERECTOMY  1978  . ESOPHAGOGASTRODUODENOSCOPY N/A 01/31/2017   Procedure: ESOPHAGOGASTRODUODENOSCOPY (EGD);  Surgeon:  Toney Reil, MD;  Location: [facility] ENDOSCOPY;  Service: Gastroenterology;  Laterality: N/A;  . HIP FRACTURE SURGERY Right      Current Outpatient Medications:  .  ALPRAZolam (XANAX) 0.5 MG tablet, Take 0.25-0.5 mg by mouth daily as needed for anxiety. , Disp: , Rfl: 0 .  amLODipine (NORVASC) 5 MG tablet, take 1 tablet by mouth once daily, Disp: 30 tablet, Rfl: 5 .  fluticasone (FLONASE) 50 MCG/ACT nasal spray, Place 2 sprays into both nostrils daily., Disp: 16 g, Rfl: 2 .  HYDROcodone-acetaminophen (NORCO/VICODIN) 5-325 MG tablet, Take 1-2 tablets by mouth every 4 (four) hours as needed for moderate pain., Disp: 30 tablet, Rfl: 0 .  latanoprost (XALATAN) 0.005 % ophthalmic solution, latanoprost 0.005 % eye drops, Disp: , Rfl:  .  mirtazapine (REMERON) 15 MG tablet, Take 7.5 mg by mouth at bedtime., Disp: , Rfl: 0 .  Misc. Devices (QUAD CANE) MISC, Please provide 1 4-point/quad cane for patient, for gait stability., Disp: 1 each, Rfl: 0 .  Multiple Vitamins-Minerals (CENTRUM SILVER PO), Take 1 tablet by mouth daily., Disp: , Rfl:  .  Multiple Vitamins-Minerals (MULTIVITAMIN ADULT PO), Take by mouth., Disp: , Rfl:  .  Olopatadine HCl 0.2 % SOLN, 1 drop to each eye daily., Disp: 2.5 mL, Rfl: 0 .  omeprazole (PRILOSEC) 20 MG capsule, Take 1 capsule (20 mg total) by mouth 2 (two) times daily before a meal., Disp: 180 capsule, Rfl: 1 .  ondansetron (ZOFRAN ODT) 4 MG disintegrating tablet, Take 1 tablet (4 mg total) by mouth every 8 (eight) hours as  needed for nausea or vomiting., Disp: 10 tablet, Rfl: 0 .  timolol (TIMOPTIC-XR) 0.5 % ophthalmic gel-forming, Place 1 drop into both eyes daily. , Disp: , Rfl: 0 .  venlafaxine XR (EFFEXOR-XR) 150 MG 24 hr capsule, Take by mouth., Disp: , Rfl:  .  clindamycin (CLEOCIN) 300 MG capsule, take 1 capsule by mouth three times a day, Disp: , Rfl: 0   Family History  Problem Relation Age of Onset  . Alcohol abuse Mother   . Arthritis Mother   .  Hyperlipidemia Mother   . Hypertension Mother   . Alcohol abuse Father   . Arthritis Father   . Hyperlipidemia Father   . Hypertension Father      Social History   Tobacco Use  . Smoking status: Never Smoker  . Smokeless tobacco: Never Used  Substance Use Topics  . Alcohol use: No    Alcohol/week: 0.0 oz  . Drug use: No    Allergies as of 04/05/2017 - Review Complete 04/05/2017  Allergen Reaction Noted  . Codeine Nausea And Vomiting 11/03/2011  . Morphine and related Other (See Comments) 05/27/2014    Review of Systems:    All systems reviewed and negative except where noted in HPI.   Physical Exam:  BP (!) 111/56   Pulse (!) 102   Temp 98.7 F (37.1 C) (Oral)   Ht 5\' 1"  (1.549 m)   Wt 139 lb 3.2 oz (63.1 kg)   BMI 26.30 kg/m  No LMP recorded. Patient has had a hysterectomy.  General:   Alert,  Well-developed, well-nourished, pleasant and cooperative in NAD Head:  Normocephalic and atraumatic. Eyes:  Sclera clear, no icterus.   Conjunctiva pink. Ears:  Normal auditory acuity. Nose:  No deformity, discharge, or lesions. Mouth:  No deformity or lesions,oropharynx pink & moist. Neck:  Supple; no masses or thyromegaly. Lungs:  Respirations even and unlabored.  Clear throughout to auscultation.   No wheezes, crackles, or rhonchi. No acute distress. Heart:  Regular rate and rhythm; no murmurs, clicks, rubs, or gallops. Abdomen:  Normal bowel sounds. Soft, non-tender and non-distended without masses, hepatosplenomegaly or hernias noted.  No guarding or rebound tenderness.   Rectal: Not performed Msk:  Symmetrical without gross deformities. Good, equal movement & strength bilaterally. Pulses:  Normal pulses noted. Extremities:  No clubbing or edema.  No cyanosis. Neurologic:  Alert and oriented x3;  grossly normal neurologically. Skin:  Intact without significant lesions or rashes. No jaundice. Psych:  Alert and cooperative. Normal mood and affect.  Imaging  Studies: No abdominal imaging  Assessment and Plan:   Sarah [name] is a 84 y.o. female with history of dementia, poor dentition, medium-size hiatal hernia, had episode of food impaction, status post EGD in 01/2017, removal of food bolus, erosive esophagitis here for follow-up.She is currently asymptomatic on omeprazole 20 mg 2 times daily  - Continue omeprazole 20 mg 2 times daily for 2 more months - Schedule EGD in next 2 weeks to confirm healing of erosive esophagitis  Follow up based on the EGD findings   Arlyss Repressohini R Vanga, MD

## 2017-04-06 NOTE — Telephone Encounter (Signed)
LMTCB to let daughter know that we are in the process of completing packet that was sent.

## 2017-04-06 NOTE — Telephone Encounter (Signed)
Pt daughter called back to make sure form is filled out and faxed back as soon as possible. Please fax to [phone] cb for facility director is [phone]

## 2017-04-06 NOTE — Telephone Encounter (Signed)
Pt daughter called back I gave her the message about the packet being in process py was grateful

## 2017-04-06 NOTE — Telephone Encounter (Signed)
PTs daughter called to say [facility] has not received form back.  She says to please make sure PCP signs the form and it can be sent via e-mail to Alda BertholdMary Klatt at  [email or web address] or faxed to [phone]

## 2017-04-11 NOTE — Telephone Encounter (Signed)
Please advise 

## 2017-04-11 NOTE — Telephone Encounter (Signed)
[facility] calling and checking status, they have not received anything. Sarah [name] call back [phone] fax [phone]

## 2017-04-11 NOTE — Telephone Encounter (Signed)
Paperwork faxed to facility and copy emailed to facility administrator M. Mahlon GammonKlatt., called and notified patient daughter.

## 2017-04-11 NOTE — Telephone Encounter (Signed)
Olegario MessierKathy return the office call.  [phone]

## 2017-04-11 NOTE — Telephone Encounter (Signed)
Left message for Sarah [name] to call back.

## 2017-04-12 ENCOUNTER — Telehealth: Payer: Self-pay

## 2017-04-12 NOTE — Telephone Encounter (Signed)
Patient daughter said facility called her and stated that they had received the new paperwork that was addend for patient with DX of dementia.

## 2017-04-12 NOTE — Telephone Encounter (Signed)
refaxed paperwork with corrections

## 2017-04-12 NOTE — Telephone Encounter (Signed)
See other phone note

## 2017-04-12 NOTE — Telephone Encounter (Signed)
Copied from CRM [id number]. Topic: General - Other >> Apr 12, 2017  1:24 PM Gerrianne ScalePayne, Angela L wrote: Reason for CRM: Danelle from [facility] in [place] [phone] calling about clarification on paper work that they has received yesterday for pt to move in with them she would like for someone to give her a call back

## 2017-04-14 ENCOUNTER — Telehealth: Payer: Self-pay

## 2017-04-14 NOTE — Telephone Encounter (Signed)
Copied from CRM #[id number]. Topic: General - Other >> Apr 12, 2017  1:24 PM Payne, Angela L wrote: Reason for CRM: Danelle from [facility] in [place] [phone] calling about clarification on paper work that they has received yesterday for pt to move in with them she would like for someone to give her a call back   >> Apr 14, 2017  4:08 PM Barksdale, Harvey B wrote: Contact Danielle to clarify paperwork @ [phone] 

## 2017-04-15 ENCOUNTER — Telehealth: Payer: Self-pay

## 2017-04-15 NOTE — Telephone Encounter (Signed)
Spoke with Geologist, engineeringKatelyn at [facility] [place]. She stated she would be faxing over clarification paperwork. Awaiting paperwork at this time.

## 2017-04-15 NOTE — Telephone Encounter (Signed)
Copied from CRM [id number]. Topic: General - Other >> Apr 12, 2017  1:24 PM Gerrianne ScalePayne, Angela L wrote: Reason for CRM: Danelle from [facility] [place] [phone] calling about clarification on paper work that they has received yesterday for pt to move in with them she would like for someone to give her a call back   >> Apr 14, 2017  4:08 PM Alexander BergeronBarksdale, [place] B wrote: Rolena Infanteontact Danielle to clarify paperwork @ [phone]

## 2017-04-18 ENCOUNTER — Other Ambulatory Visit: Payer: Self-pay | Admitting: Internal Medicine

## 2017-04-18 NOTE — Telephone Encounter (Signed)
Vicodin refill request Xanax clarification   LOV 02/15/17 with Dr. Lorin PicketScott  [facility].   Please contact the senior living center [phone]  Sarah PeersKatelyn [name].  Thanks.

## 2017-04-18 NOTE — Telephone Encounter (Signed)
Returned call to Sarah PeersKatelyn [name] at [facility] and informed her according to our records it looks like Dr. Juliene PinaMody last prescribed the Hydrocodone and a historical provider for the xanax but informed her  I would send a message to Dr. Lorin PicketScott about the xanax refill. Please advise.

## 2017-04-18 NOTE — Telephone Encounter (Signed)
Copied from CRM [phone]. Topic: Quick Communication - Rx Refill/Question >> Apr 18, 2017 11:52 AM Sarah [name], Sarah [name] wrote: Medication: HYDROcodone-acetaminophen (NORCO/VICODIN) 5-325 MG tablet [[id number]][[id number]]  Has the patient contacted their pharmacy? Yes.   (Agent: If no, request that the patient contact the pharmacy for the refill.) Preferred Pharmacy (with phone number or street name): [facility] Agent: Please be advised that RX refills may take up to 3 business days. We ask that you follow-up with your pharmacy.  Needing Clarification on the Xanax   Contact the senior living center [phone]  [name]

## 2017-04-21 NOTE — Telephone Encounter (Signed)
Dr. Lorin PicketScott does ;not fill Xanax

## 2017-04-22 NOTE — Telephone Encounter (Signed)
Called and left message for Sarah BattyKatelyn  [name] that patients Hydrocodone has been filled but the provider does not fill Xanax and to call the office if she has any questions or concerns.

## 2017-05-10 ENCOUNTER — Telehealth: Payer: Self-pay

## 2017-05-10 DIAGNOSIS — R946 Abnormal results of thyroid function studies: Secondary | ICD-10-CM | POA: Diagnosis not present

## 2017-05-10 DIAGNOSIS — K219 Gastro-esophageal reflux disease without esophagitis: Secondary | ICD-10-CM | POA: Diagnosis not present

## 2017-05-10 DIAGNOSIS — R69 Illness, unspecified: Secondary | ICD-10-CM | POA: Diagnosis not present

## 2017-05-10 DIAGNOSIS — R011 Cardiac murmur, unspecified: Secondary | ICD-10-CM | POA: Diagnosis not present

## 2017-05-10 DIAGNOSIS — I10 Essential (primary) hypertension: Secondary | ICD-10-CM | POA: Diagnosis not present

## 2017-05-10 NOTE — Telephone Encounter (Signed)
Copied from CRM [id number]. Topic: Medical Record Request - Other >> May 10, 2017  1:13 PM Waymon AmatoBurton, Donna F wrote: Pt daughter Lorretta Harpkathy jpickeral is calliing to let Dr. Lorin PicketScott know that the patient has been moved to a  assisted living [facility] [place] [address] [place] [id number] [phone]  fax [phone] and they need all of patient records to be faxed to them and the daughter lives out of [place] and can not come in to sign a release form  And the provider that patient was sent to in [place]  Dr. Sharyn LullSabitha K. Vasireddy MD. Internal medicine [address] [place] [id number] [phone]  fax [phone] and they need the records as well espically before 06/28/17  Route to [facility] Pool for [facility]. For all other clinics, route to the clinic's PEC Pool. Best number for daughter  [phone] cell number please call once records have been sent to both places - and to leave a message on voicemail If needed

## 2017-05-11 NOTE — Telephone Encounter (Signed)
LMTCB

## 2017-05-17 ENCOUNTER — Ambulatory Visit: Payer: Self-pay | Admitting: Internal Medicine

## 2017-05-26 NOTE — Telephone Encounter (Signed)
Spoke with patients nurse in [place] to let her know that patient is no longer under our care.

## 2017-05-26 NOTE — Telephone Encounter (Signed)
Please advise 

## 2017-05-26 NOTE — Telephone Encounter (Signed)
Caller name: Meriel Pica  Relation to pt:  [facility] Call back number: phone #[phone]  Suite memory unit fax # [phone]     Reason for call:  Physician communication form faxed over on 04/19/17 to [phone]. Requesting current medication list and most importantly ALPRAZolam (XANAX) 0.5 MG tablet orders, patient has 1 pill left. Assisting living would like all records but would like to discuss current medication, please advise.  *form being re fax to different # within the office [phone] or [phone], please note when form is received.

## 2017-06-07 ENCOUNTER — Ambulatory Visit: Payer: Self-pay | Admitting: Gastroenterology

## 2018-10-03 IMAGING — CT CT HEAD W/O CM
3 series · 16 of 47 positions shown, 19 images · non-contrast
Comparison: 12/15/2016

CLINICAL DATA: Fall to the ground with trauma to the left frontal
region.

EXAM:
CT HEAD WITHOUT CONTRAST
TECHNIQUE: Contiguous axial images were obtained from the base of the skull
through the vertex without intravenous contrast.

[Series 2: head wo · axial · 0.43mm/px · z∈[+430,+555]mm · 10 of 30 slices shown, 13 images]
[im 3/30  brain]
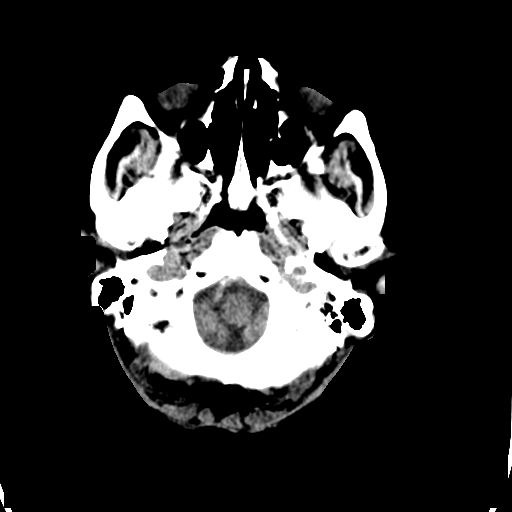
[im 3/30  bone]
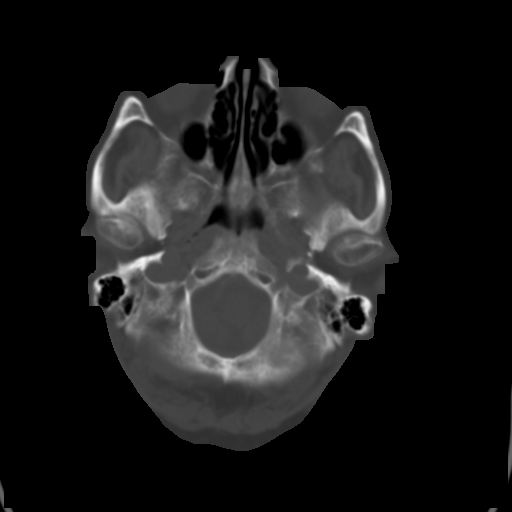
[im 6/30  brain]
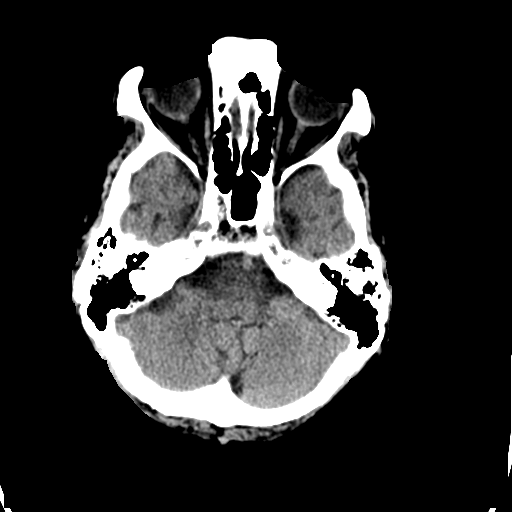
[im 9/30  brain]
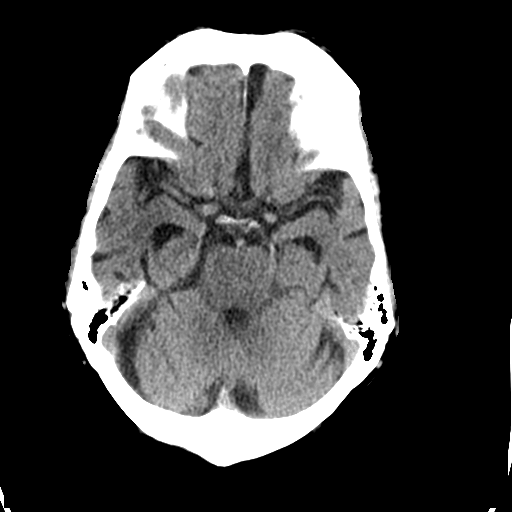
[im 11/30  brain]
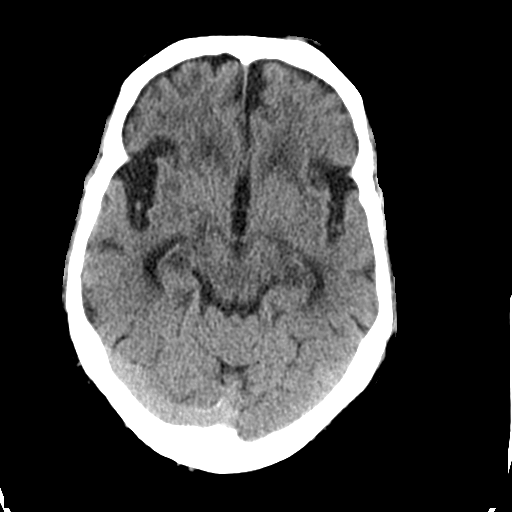
[im 14/30  brain]
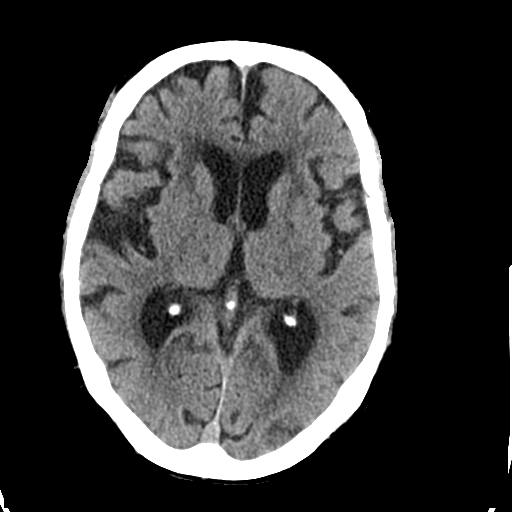
[im 14/30  bone]
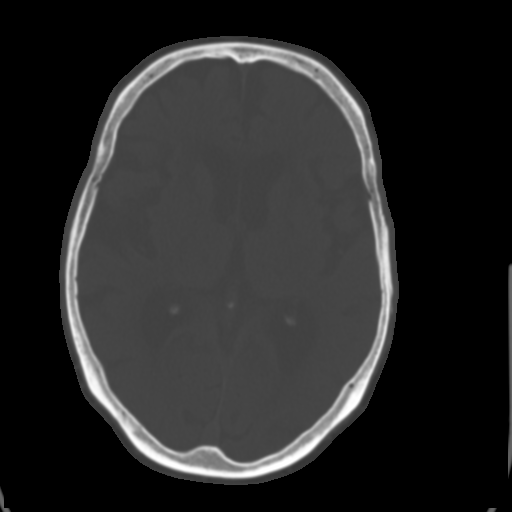
[im 17/30  brain]
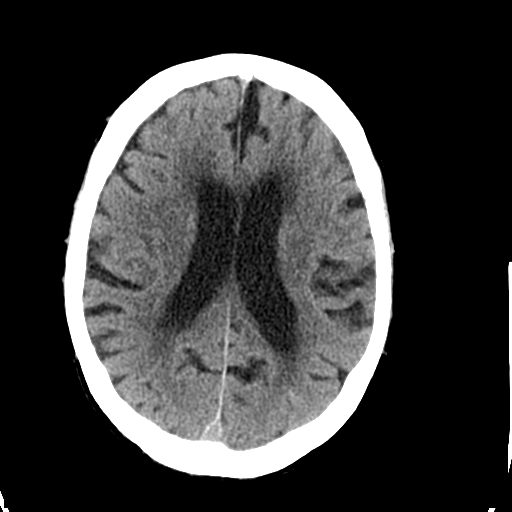
[im 20/30  brain]
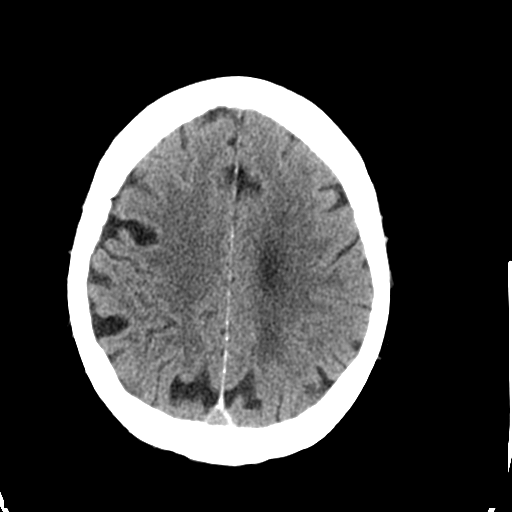
[im 23/30  brain]
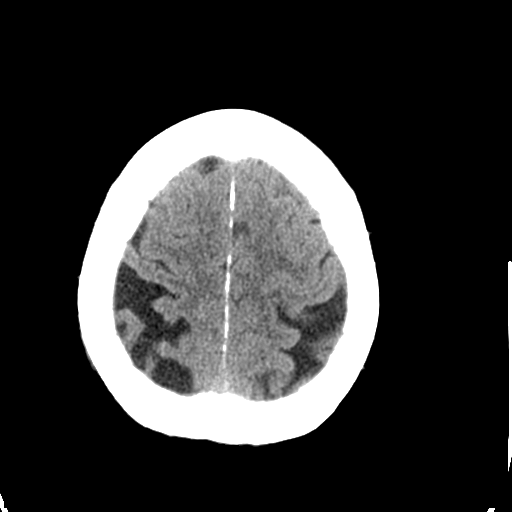
[im 25/30  brain]
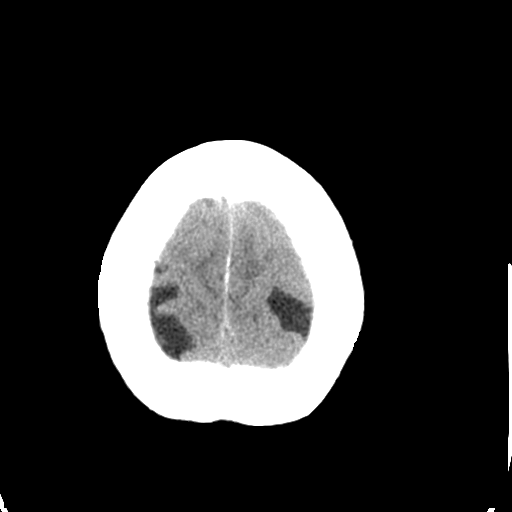
[im 25/30  bone]
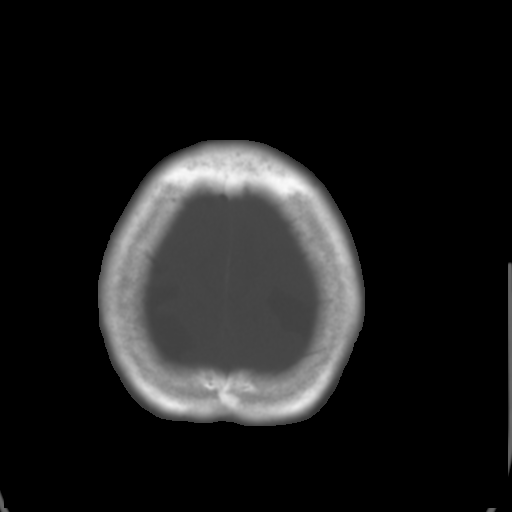
[im 28/30  brain]
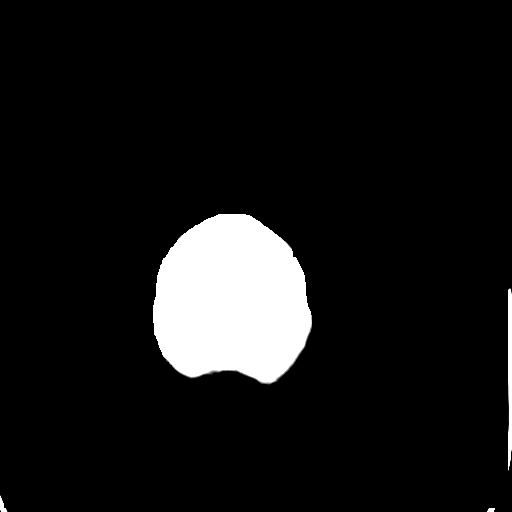

[Series 4: coronal soft tissue · coronal · 0.32mm/px · 3 of 72 slices shown]
[im 24/72  brain]
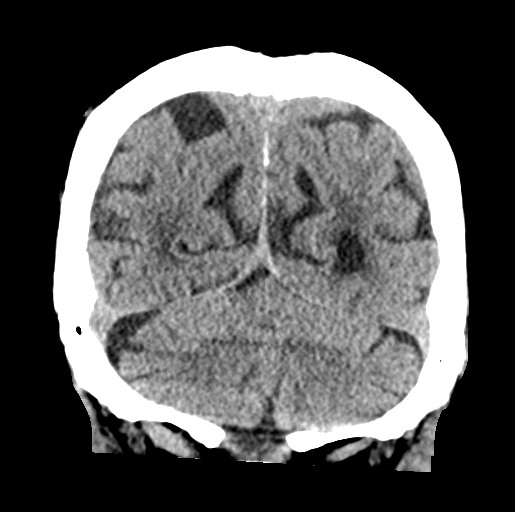
[im 32/72  brain]
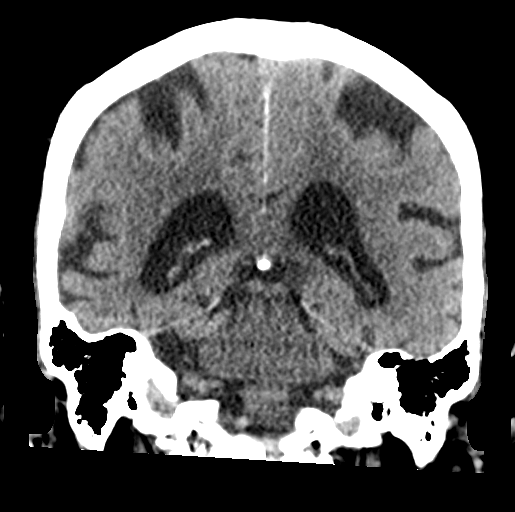
[im 40/72  brain]
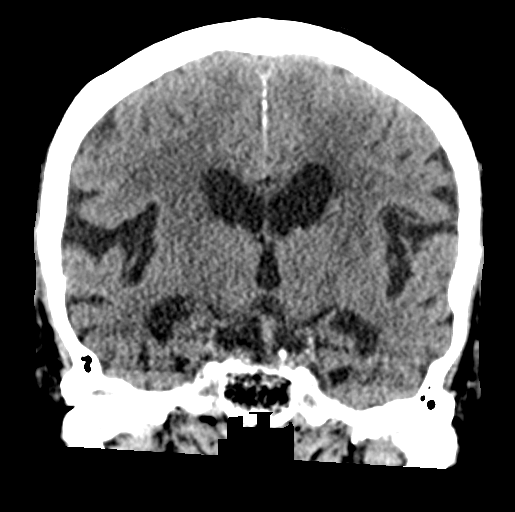

[Series 5: sagittal soft tissue · sagittal · 0.31mm/px · 3 of 53 slices shown]
[im 18/53  brain]
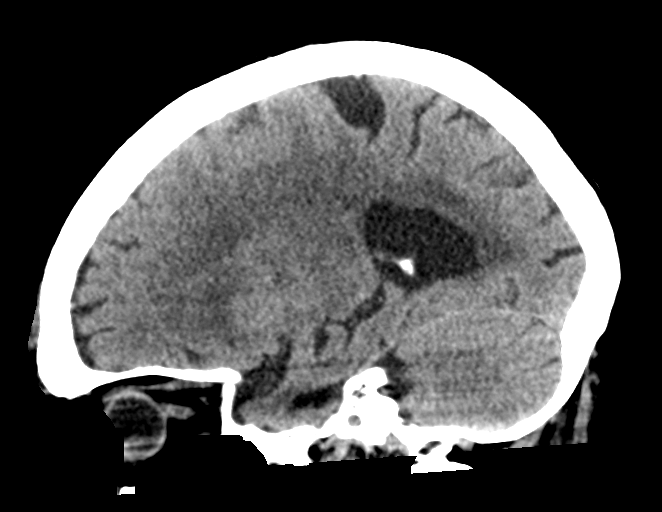
[im 27/53  brain]
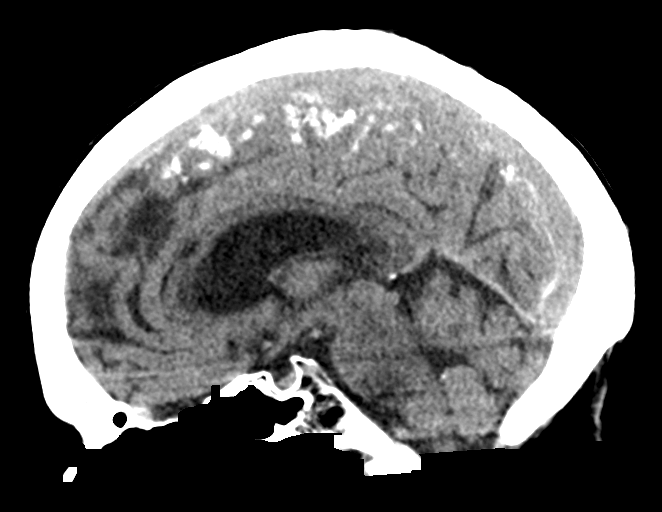
[im 35/53  brain]
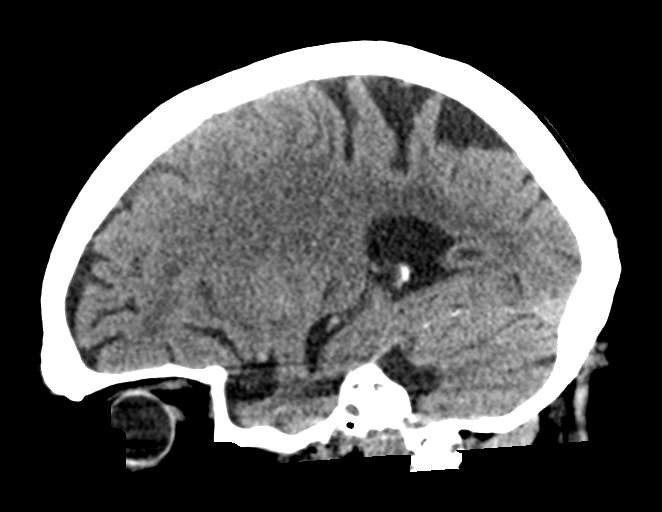

[16 of 47 positions shown; findings below may reference images not displayed]

FINDINGS: Brain: Generalized atrophy. Chronic small-vessel ischemic changes
throughout the white matter. No sign of acute infarction, mass
lesion, hemorrhage, hydrocephalus or extra-axial collection.

Vascular: There is atherosclerotic calcification of the major
vessels at the base of the brain.

Skull: No skull fracture.

Sinuses/Orbits: Clear/normal

Other: Left frontal scalp swelling.
IMPRESSION: Left frontal scalp swelling. No skull fracture. No sign of
intracranial injury. Atrophy and chronic small-vessel ischemic
changes.
